# Patient Record
Sex: Female | Born: 1985
Health system: Southern US, Community
[De-identification: ages and names within clinical notes are randomized; demographics above are authoritative.]

## PROBLEM LIST (undated history)

## (undated) DIAGNOSIS — M545 Low back pain, unspecified: Secondary | ICD-10-CM

## (undated) DIAGNOSIS — G8929 Other chronic pain: Secondary | ICD-10-CM

## (undated) DIAGNOSIS — R7303 Prediabetes: Secondary | ICD-10-CM

## (undated) DIAGNOSIS — J3489 Other specified disorders of nose and nasal sinuses: Secondary | ICD-10-CM

## (undated) DIAGNOSIS — F419 Anxiety disorder, unspecified: Secondary | ICD-10-CM

## (undated) DIAGNOSIS — Q796 Ehlers-Danlos syndrome, unspecified: Secondary | ICD-10-CM

## (undated) DIAGNOSIS — I10 Essential (primary) hypertension: Secondary | ICD-10-CM

## (undated) DIAGNOSIS — F329 Major depressive disorder, single episode, unspecified: Secondary | ICD-10-CM

## (undated) DIAGNOSIS — F32A Depression, unspecified: Secondary | ICD-10-CM

## (undated) HISTORY — DX: Ehlers-Danlos syndrome, unspecified: Q79.60

## (undated) HISTORY — DX: Depression, unspecified: F32.A

## (undated) HISTORY — PX: EAR CYST EXCISION: SHX22

## (undated) HISTORY — PX: CERVICAL BIOPSY: SHX590

## (undated) HISTORY — DX: Essential (primary) hypertension: I10

---
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
## 2007-08-26 IMAGING — US US OB COMP LESS 14 WK
1 series · 18 of 28 positions shown · non-contrast
Comparison: none

CLINICAL DATA: Uncertain LMP.  LMP 11/11/05.  Evaluate dating and viability.  
 OBSTETRICAL ULTRASOUND <14 WKS:
TECHNIQUE: Transabdominal ultrasound was performed for evaluation of the gestation as well as the maternal uterus and adnexal regions.

[Series 1: us ob comp less 14 wks · 18 of 31 slices shown]
[im 1/31]
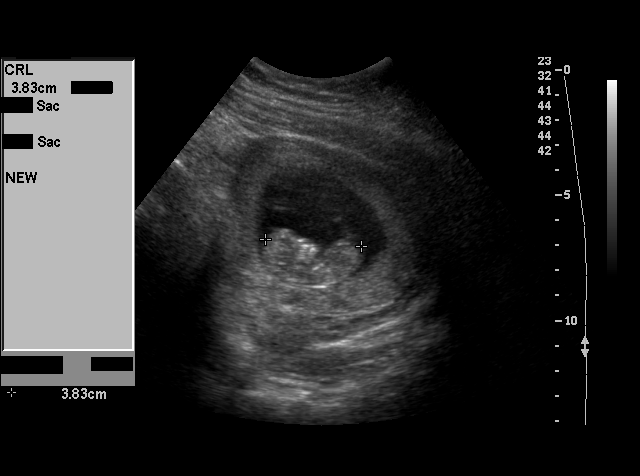
[im 3/31]
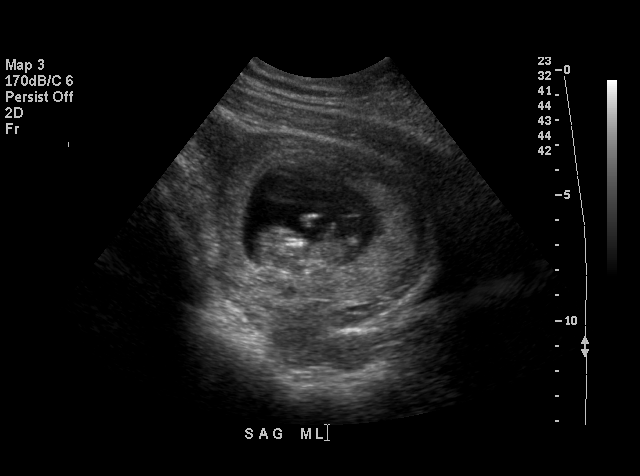
[im 4/31]
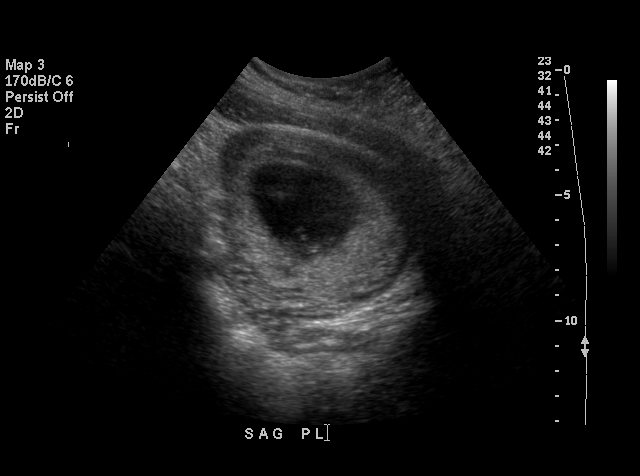
[im 6/31]
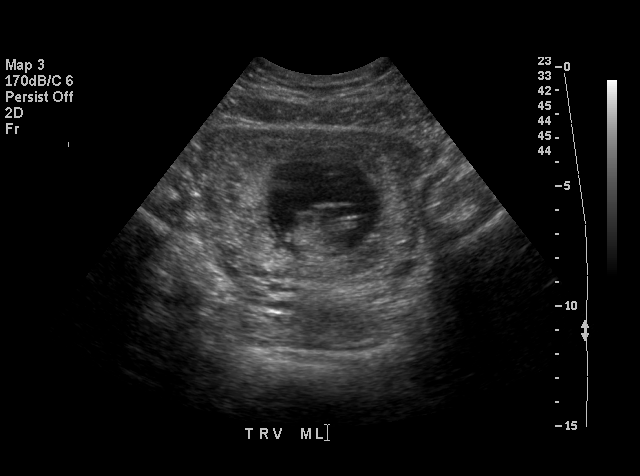
[im 8/31]
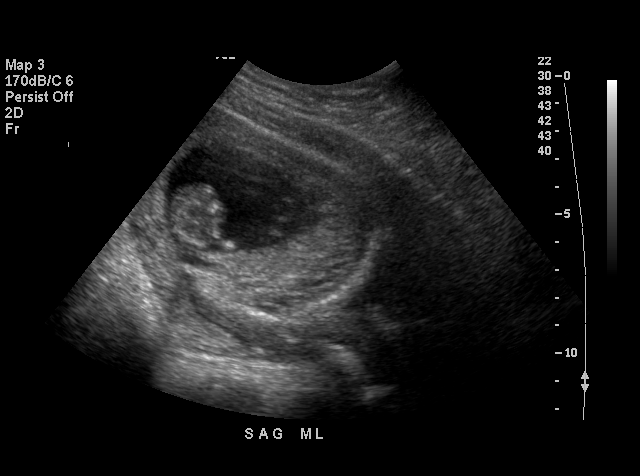
[im 9/31]
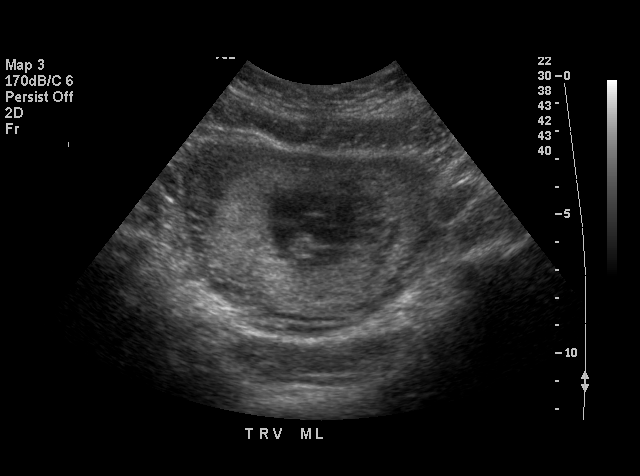
[im 12/31]
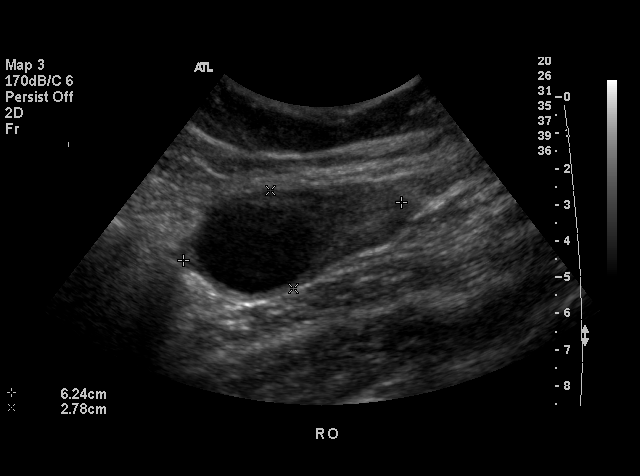
[im 13/31]
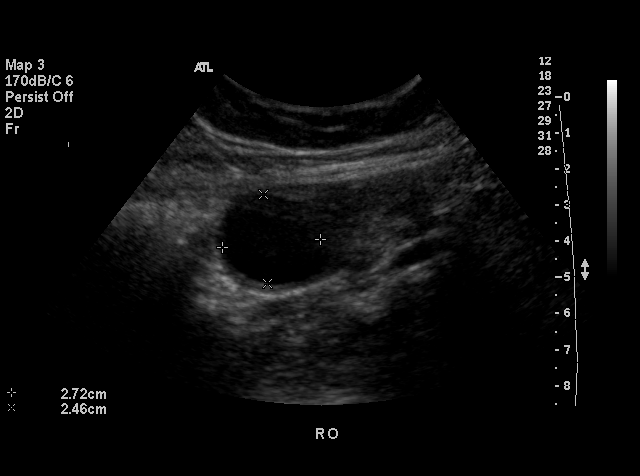
[im 15/31]
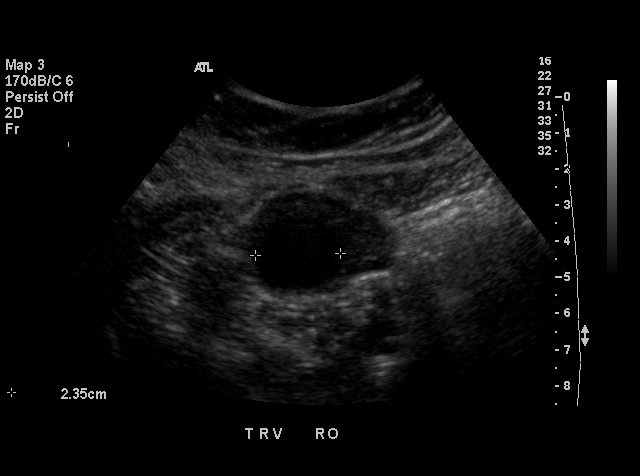
[im 16/31]
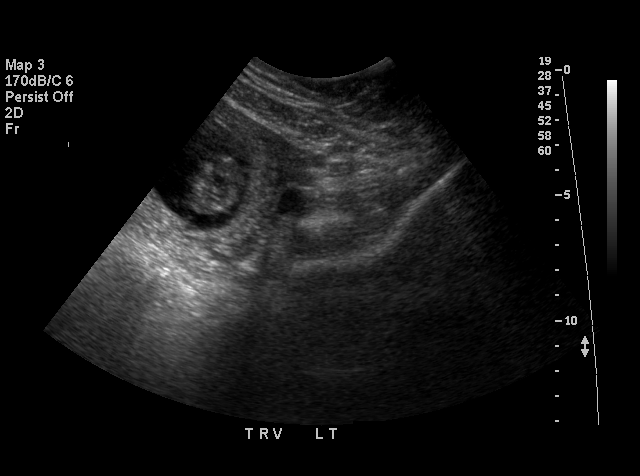
[im 18/31]
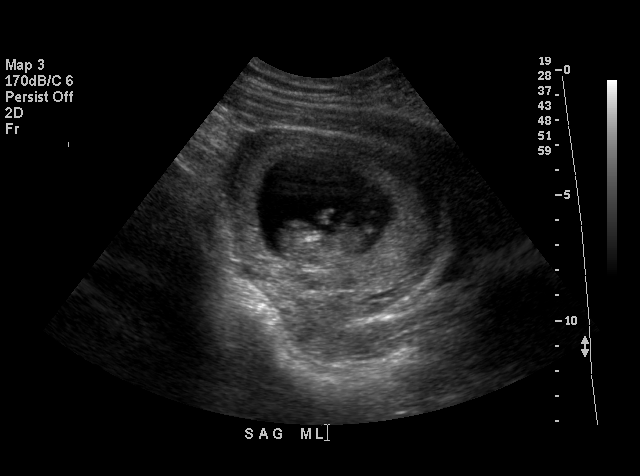
[im 19/31]
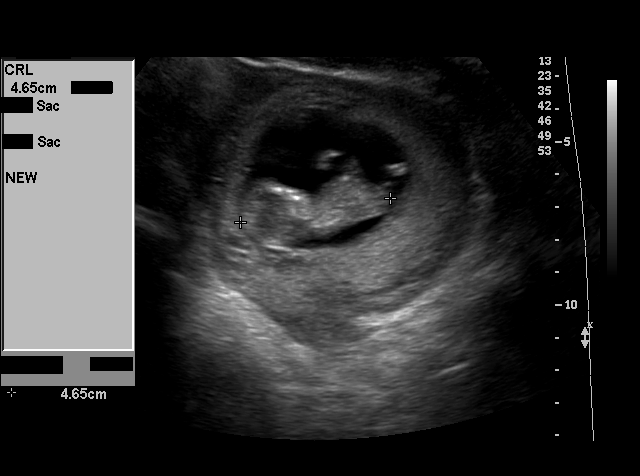
[im 22/31]
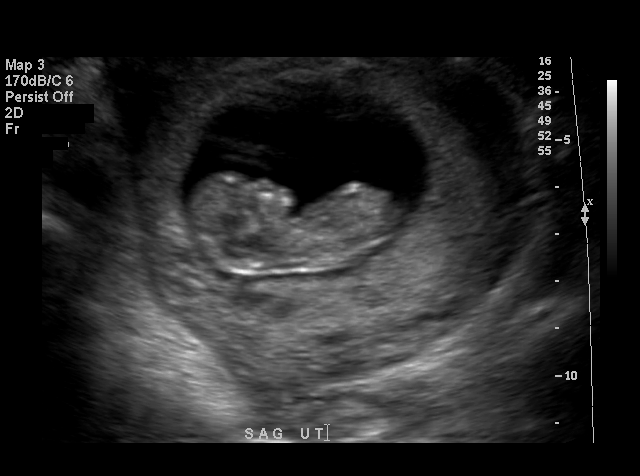
[im 24/31]
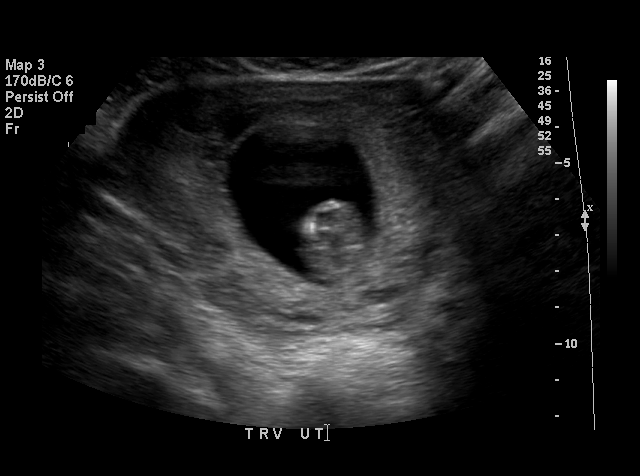
[im 25/31]
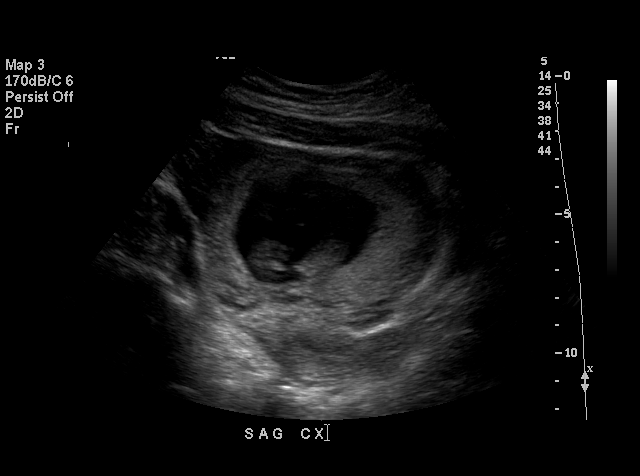
[im 27/31]
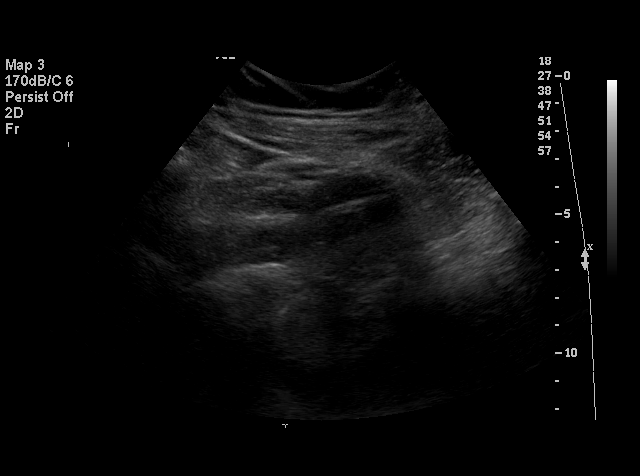
[im 28/31]
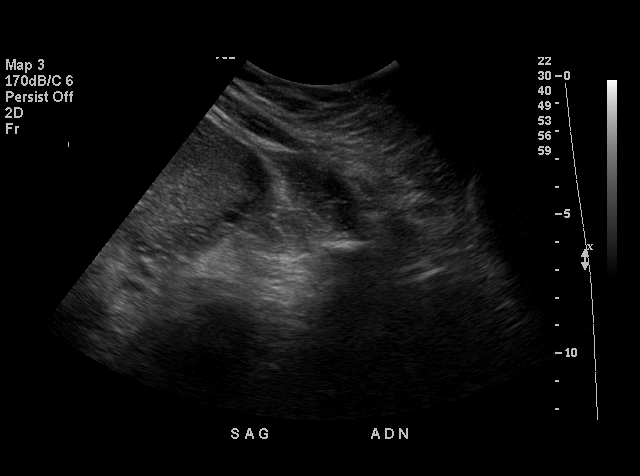
[im 31/31]
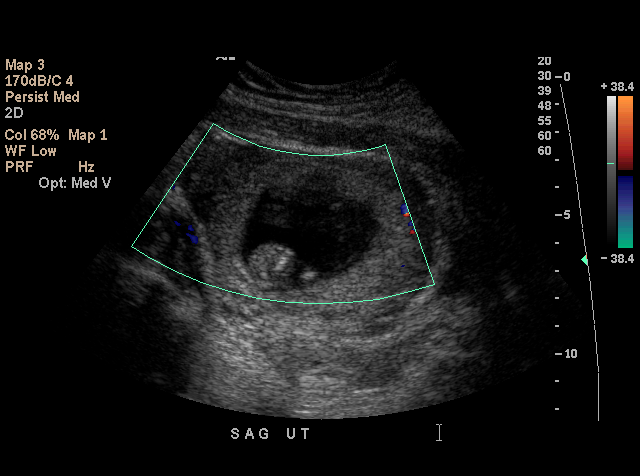

[18 of 28 positions shown; findings below may reference images not displayed]

FINDINGS: A single living intrauterine gestation is seen with measured heart rate of 162.  Fetal movement is noted.  Crown rump length measures 4.8 cm, corresponding with a gestational age of 11 weeks 4 days.  There is no evidence of subchorionic hemorrhage.  No fibroids or other uterine abnormalities are identified.  
 The right ovary contains a corpus luteum cyst measuring 2.5 cm.  The left ovary is not directly visualized, but no adnexal masses or free fluid are identified.
IMPRESSION: 1.  Single living intrauterine gestation with estimated gestational age of 11 weeks 4 days and sonographic EDC of 11/12/05.  This is concordant with LMP.
 2.  No maternal uterine or adnexal abnormality identified.

## 2008-02-22 IMAGING — US US OB FOLLOW-UP
1 series · 13 of 28 positions shown · non-contrast
Comparison: none

CLINICAL DATA: Assess growth and fetal well-being.  Pregnancy induced hypertension with size greater than dates.

[Series 1: us ob follow-up · 35 acquisitions, 13 frames shown]
[im 2/35]
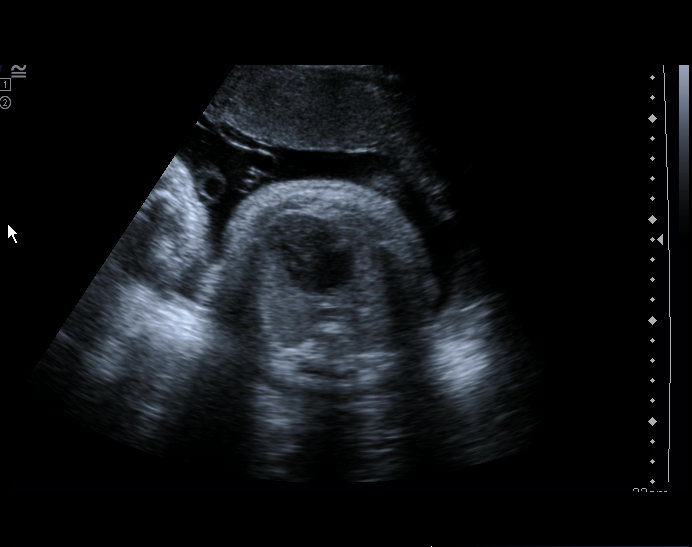
[im 4/35]
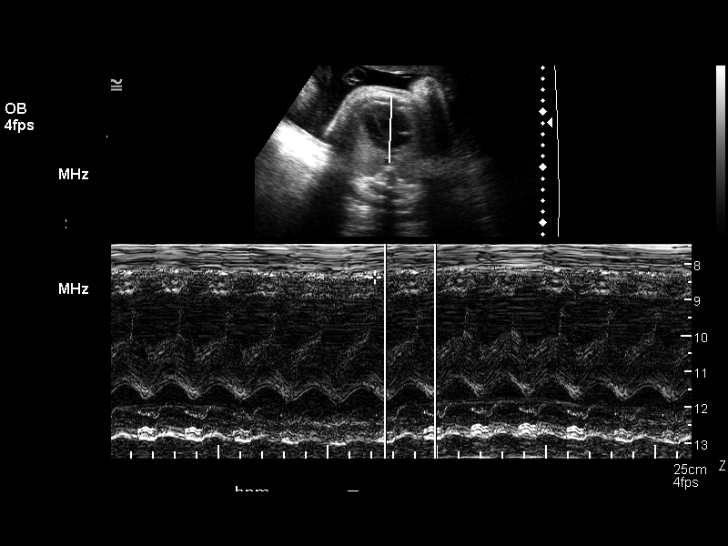
[im 7/35]
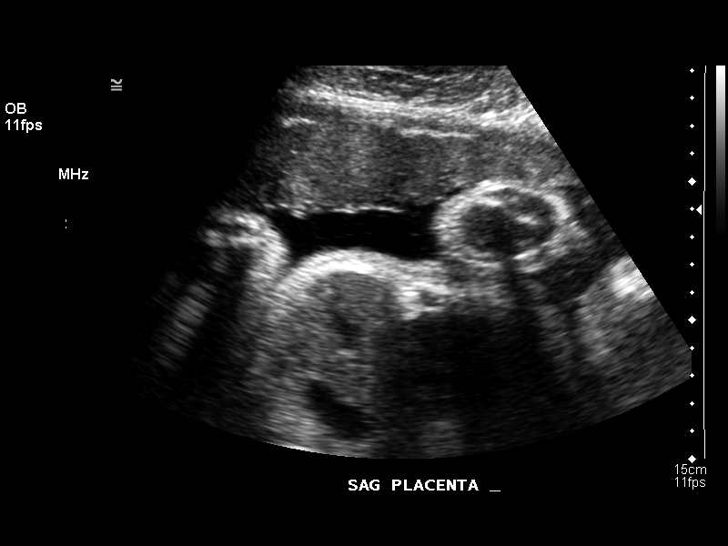
[im 9/35]
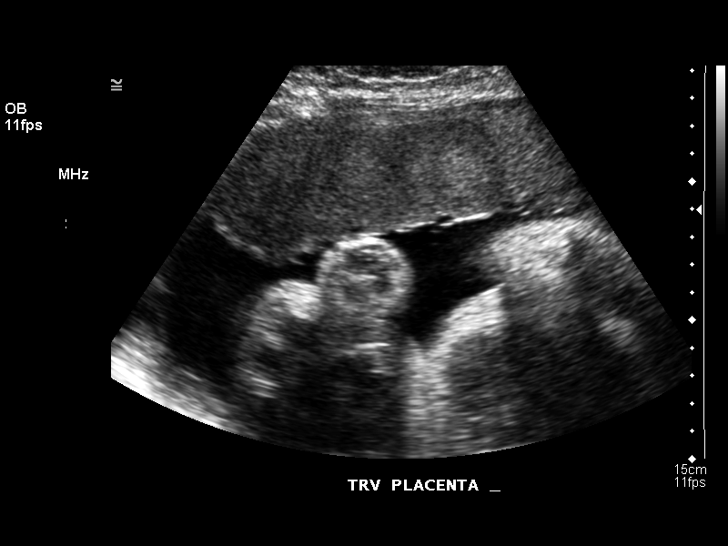
[im 12/35]
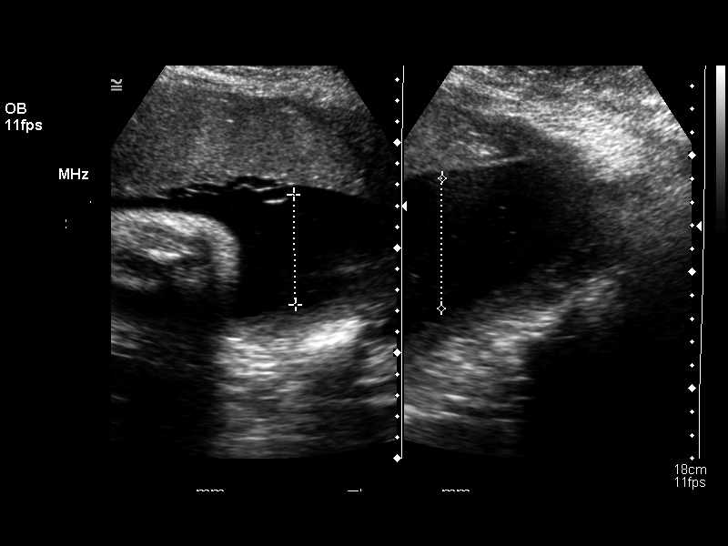
[im 14/35]
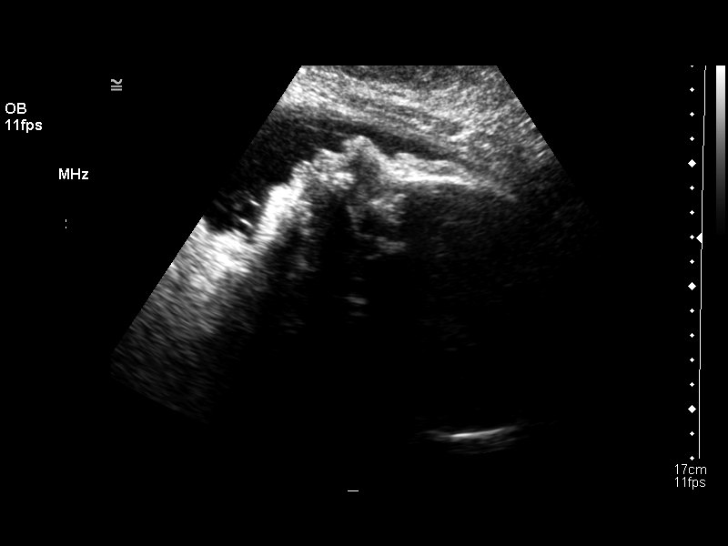
[im 18/35]
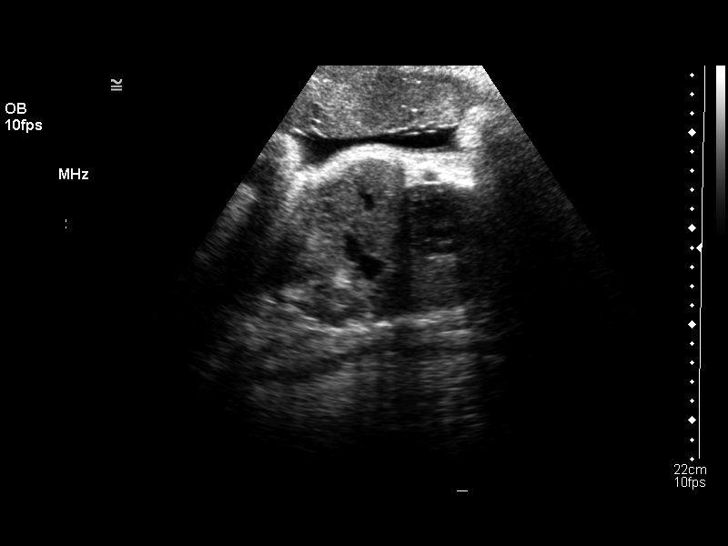
[im 21/35]
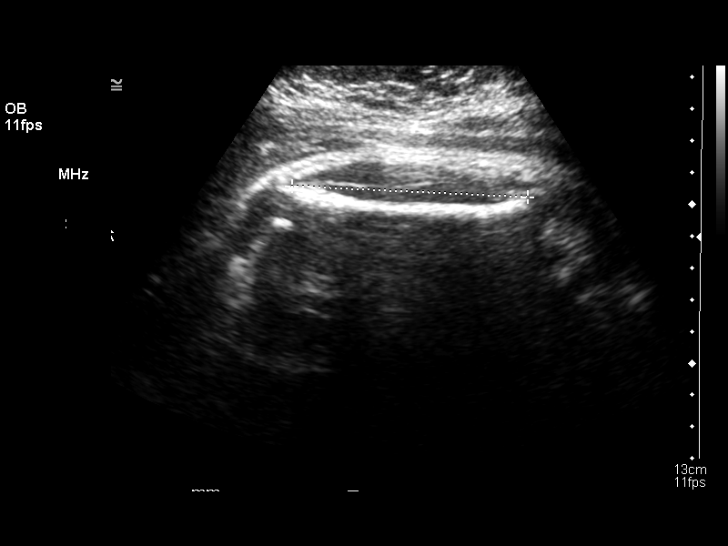
[im 23/35]
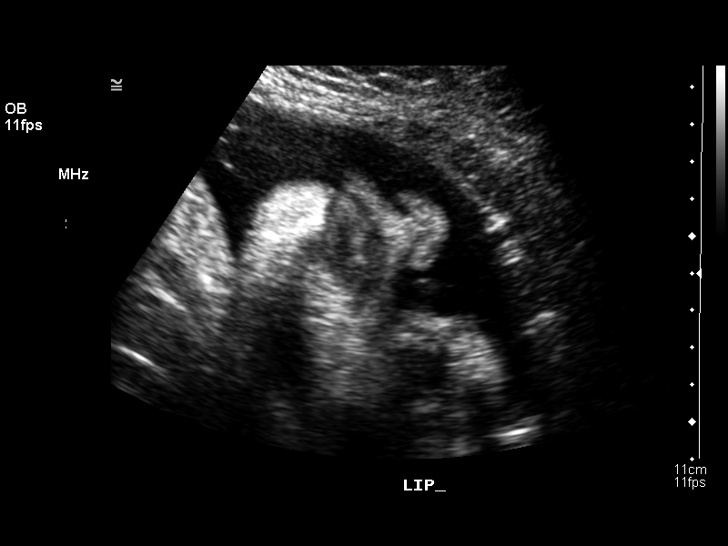
[im 26/35]
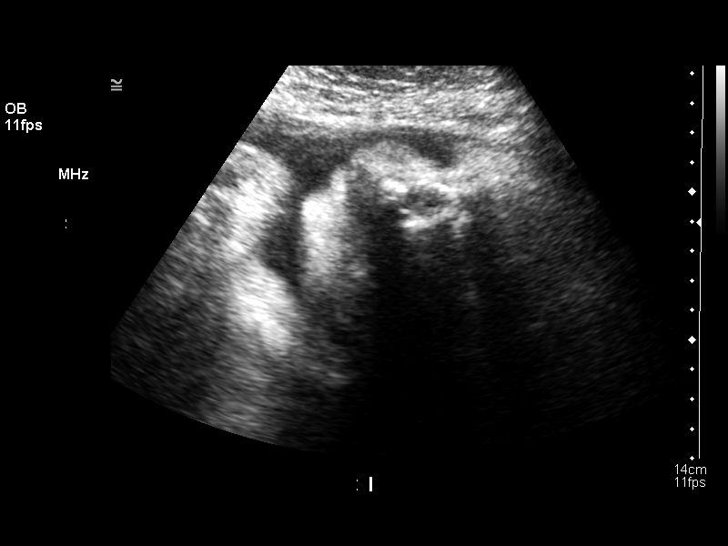
[im 28/35]
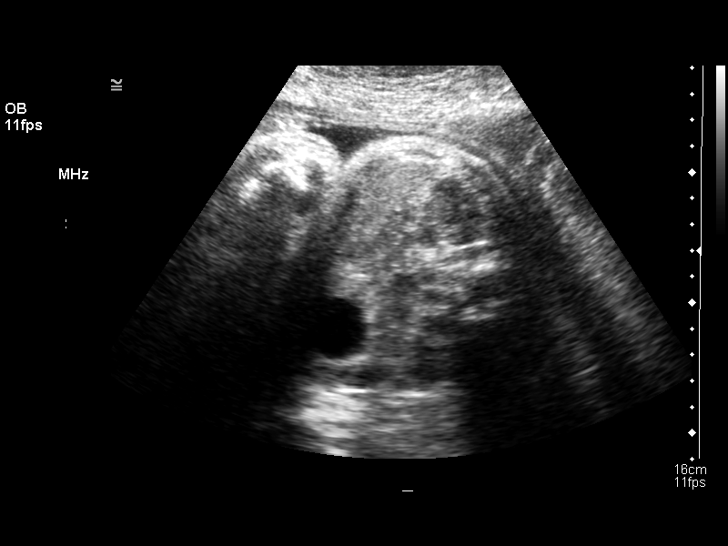
[im 31/35]
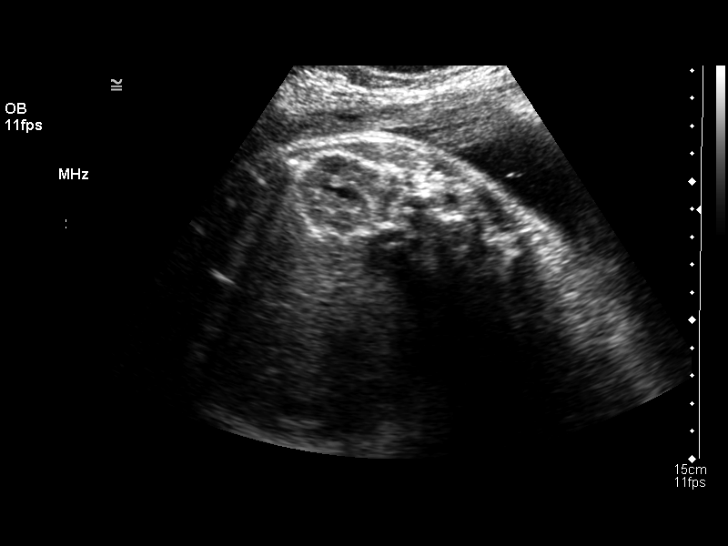
[im 33/35]
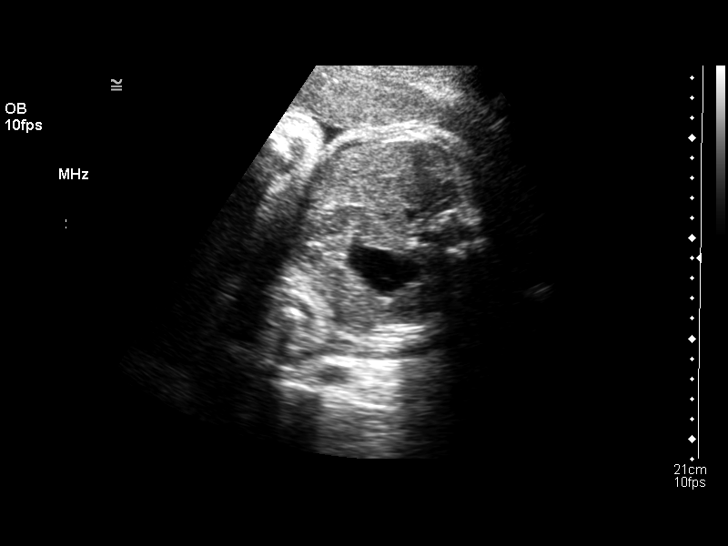

[13 of 28 positions shown; findings below may reference images not displayed]

OBSTETRICAL ULTRASOUND RE-EVALUATION:
 Number of Fetuses: 1
 Heart Rate:  131
 Movement:  Yes
 Breathing:  No
 Presentation:  Cephalic
 Placental Location:  Anterior
 Grade:  II
 Previa:  No
 Amniotic Fluid (subjective):  Normal
 Amniotic Fluid (objective):  21.6 cm AFI (5th -95th%ile = 7.3 – 24.4 cm for 37 wks)

 FETAL BIOMETRY
 BPD:  9.2 cm   37 w 2 d
 HC:  33.0 cm   37 w 2 d
 AC:   36.0 cm  39 w 4 d
 FL:   7.4 cm  37 w 6 d

 Mean GA:  38 w 0 d  US EDC:  11/07/05
 Assigned GA:  37 w 3 d  Assigned EDC:  11/11/05
 Fetal indices are within normal limits.
 EFW:  5363 + / - 532 g (H) 90th – 95th%ile (4084 – 5008 g) For 37 wks)

 FETAL ANATOMY
 Lateral Ventricles:  Visualized 
 Thalami/CSP:  Previously seen 
 Posterior Fossa:  Previously seen 
 Nuchal Region:  Previously seen 
 Spine:  Previously seen 
 4 Chamber Heart on Left:  Visualized 
 Stomach on Left:  Visualized 
 3 Vessel Cord:  Previously seen   
 Cord Insertion Site:  Previously seen 
 Kidneys:  Visualized 
 Bladder:  Visualized 
 Extremities:  Previously seen 

 ADDITIONAL ANATOMY VISUALIZED:  Diaphragm and male genitalia.

 MATERNAL UTERINE AND ADNEXAL FINDINGS
 Cervix: Not evaluated > 34 wks
 BIOPHYSICAL PROFILE

 Movement:  2    Time:  30 minutes
 Breathing:  0
 Tone: 2
 Amniotic Fluid: 2

 Total Score: 6
IMPRESSION: 1.  Single intrauterine pregnancy demonstrating an estimated gestational age by ultrasound of 38 weeks and 0 days.  Currently the abdominal circumference is larger than the remaining gestational indicators with an absolute measurement of 36.0 cm (39 weeks and 4 days), and this is 2 weeks and 1 day ahead of assigned gestational age by LMP of 37 weeks and 3 days.  Currently the estimated fetal weight is 5363 + / - 532 grams and this is between the 90th and 95th percentile for a 37 week gestation.  Findings are compatible with a large for gestational age fetus and continued close follow-up for growth is recommended to exclude the possibility of a developing macrosomic fetus.
 2.  Subjectively and quantitatively normal amniotic fluid volume.
 3.  Biophysical profile score [DATE].  The fetus score zero for lack of fetal breathing.  
 4.  No late developing fetal anatomic abnormalities are identified associated with the lateral ventricles, four chamber heart, stomach, kidneys, or bladder.

## 2010-04-23 ENCOUNTER — Encounter: Payer: Self-pay | Admitting: Obstetrics & Gynecology

## 2018-06-23 ENCOUNTER — Other Ambulatory Visit: Payer: Self-pay

## 2018-06-23 DIAGNOSIS — Z1329 Encounter for screening for other suspected endocrine disorder: Secondary | ICD-10-CM

## 2018-06-23 DIAGNOSIS — Z1389 Encounter for screening for other disorder: Secondary | ICD-10-CM

## 2018-06-23 DIAGNOSIS — Z13220 Encounter for screening for lipoid disorders: Secondary | ICD-10-CM

## 2018-06-23 DIAGNOSIS — Z13228 Encounter for screening for other metabolic disorders: Secondary | ICD-10-CM

## 2018-06-25 ENCOUNTER — Other Ambulatory Visit: Payer: Self-pay

## 2018-06-25 ENCOUNTER — Ambulatory Visit (INDEPENDENT_AMBULATORY_CARE_PROVIDER_SITE_OTHER): Payer: BLUE CROSS/BLUE SHIELD | Admitting: Family Medicine

## 2018-06-25 ENCOUNTER — Ambulatory Visit: Payer: Self-pay | Admitting: Family Medicine

## 2018-06-25 DIAGNOSIS — Z1389 Encounter for screening for other disorder: Secondary | ICD-10-CM

## 2018-06-25 DIAGNOSIS — Z1329 Encounter for screening for other suspected endocrine disorder: Secondary | ICD-10-CM

## 2018-06-25 DIAGNOSIS — Z13228 Encounter for screening for other metabolic disorders: Secondary | ICD-10-CM

## 2018-06-25 DIAGNOSIS — Z13220 Encounter for screening for lipoid disorders: Secondary | ICD-10-CM

## 2018-06-25 LAB — POCT URINALYSIS DIP (MANUAL ENTRY)
BILIRUBIN UA: NEGATIVE
BILIRUBIN UA: NEGATIVE mg/dL
GLUCOSE UA: NEGATIVE mg/dL
Leukocytes, UA: NEGATIVE
Nitrite, UA: NEGATIVE
Protein Ur, POC: 30 mg/dL — AB
RBC UA: NEGATIVE
SPEC GRAV UA: 1.025 (ref 1.010–1.025)
Urobilinogen, UA: 0.2 E.U./dL
pH, UA: 5.5 (ref 5.0–8.0)

## 2018-06-26 LAB — CMP14+EGFR
A/G RATIO: 2 (ref 1.2–2.2)
ALT: 37 IU/L — ABNORMAL HIGH (ref 0–32)
AST: 21 IU/L (ref 0–40)
Albumin: 4.5 g/dL (ref 3.8–4.8)
Alkaline Phosphatase: 54 IU/L (ref 39–117)
BUN/Creatinine Ratio: 19 (ref 9–23)
BUN: 16 mg/dL (ref 6–20)
Bilirubin Total: 0.3 mg/dL (ref 0.0–1.2)
CALCIUM: 10.4 mg/dL — AB (ref 8.7–10.2)
CO2: 24 mmol/L (ref 20–29)
CREATININE: 0.86 mg/dL (ref 0.57–1.00)
Chloride: 102 mmol/L (ref 96–106)
GFR, EST AFRICAN AMERICAN: 103 mL/min/{1.73_m2} (ref >59)
GFR, EST NON AFRICAN AMERICAN: 90 mL/min/{1.73_m2} (ref >59)
Globulin, Total: 2.3 g/dL (ref 1.5–4.5)
Glucose: 110 mg/dL — ABNORMAL HIGH (ref 65–99)
Potassium: 4.4 mmol/L (ref 3.5–5.2)
Sodium: 140 mmol/L (ref 134–144)
TOTAL PROTEIN: 6.8 g/dL (ref 6.0–8.5)

## 2018-06-26 LAB — LIPID PANEL
CHOL/HDL RATIO: 3.7 ratio (ref 0.0–4.4)
CHOLESTEROL TOTAL: 191 mg/dL (ref 100–199)
HDL: 52 mg/dL (ref >39)
LDL CALC: 86 mg/dL (ref 0–99)
TRIGLYCERIDES: 263 mg/dL — AB (ref 0–149)
VLDL CHOLESTEROL CAL: 53 mg/dL — AB (ref 5–40)

## 2018-06-26 LAB — TSH: TSH: 0.946 u[IU]/mL (ref 0.450–4.500)

## 2018-06-27 ENCOUNTER — Telehealth (INDEPENDENT_AMBULATORY_CARE_PROVIDER_SITE_OTHER): Payer: BLUE CROSS/BLUE SHIELD | Admitting: Family Medicine

## 2018-06-27 ENCOUNTER — Encounter: Payer: Self-pay | Admitting: Family Medicine

## 2018-06-27 ENCOUNTER — Other Ambulatory Visit: Payer: Self-pay

## 2018-06-27 ENCOUNTER — Ambulatory Visit: Payer: Self-pay | Admitting: Family Medicine

## 2018-06-27 DIAGNOSIS — Z636 Dependent relative needing care at home: Secondary | ICD-10-CM

## 2018-06-27 DIAGNOSIS — I10 Essential (primary) hypertension: Secondary | ICD-10-CM

## 2018-06-27 DIAGNOSIS — Z975 Presence of (intrauterine) contraceptive device: Secondary | ICD-10-CM

## 2018-06-27 DIAGNOSIS — F321 Major depressive disorder, single episode, moderate: Secondary | ICD-10-CM | POA: Diagnosis not present

## 2018-06-27 DIAGNOSIS — R002 Palpitations: Secondary | ICD-10-CM

## 2018-06-27 DIAGNOSIS — Q796 Ehlers-Danlos syndrome, unspecified: Secondary | ICD-10-CM

## 2018-06-27 MED ORDER — METOPROLOL-HYDROCHLOROTHIAZIDE 50-25 MG PO TABS: 1.0000 | ORAL_TABLET | Freq: Every day | ORAL | 1 refills | Status: DC

## 2018-06-27 MED ORDER — DULOXETINE HCL 40 MG PO CPEP: 40.0000 mg | ORAL_CAPSULE | Freq: Every day | ORAL | 1 refills | Status: DC

## 2018-06-27 NOTE — Patient Instructions (Addendum)
   If you have lab work done today you will be contacted with your lab results within the next 2 weeks.  If you have not heard from us then please contact us. The fastest way to get your results is to register for My Chart.   IF you received an x-ray today, you will receive an invoice from [facility]. Please contact [facility] at [phone] with questions or concerns regarding your invoice.   IF you received labwork today, you will receive an invoice from [facility]. Please contact [facility] at [phone] with questions or concerns regarding your invoice.   Our billing staff will not be able to assist you with questions regarding bills from these companies.  You will be contacted with the lab results as soon as they are available. The fastest way to get your results is to activate your My Chart account. Instructions are located on the last page of this paperwork. If you have not heard from us regarding the results in 2 weeks, please contact this office.     Palpitations Palpitations are feelings that your heartbeat is irregular or is faster than normal. It may feel like your heart is fluttering or skipping a beat. Palpitations are usually not a serious problem. They may be caused by many things, including smoking, caffeine, alcohol, stress, and certain medicines or drugs. Most causes of palpitations are not serious. However, some palpitations can be a sign of a serious problem. You may need further tests to rule out serious medical problems. Follow these instructions at home:     Pay attention to any changes in your condition. Take these actions to help manage your symptoms: Eating and drinking  Avoid foods and drinks that may cause palpitations. These may include: ? Caffeinated coffee, tea, soft drinks, diet pills, and energy drinks. ? Chocolate. ? Alcohol. Lifestyle  Take steps to reduce your stress and anxiety. Things that can help you relax  include: ? Yoga. ? Mind-body activities, such as deep breathing, meditation, or using words and images to create positive thoughts (guided imagery). ? Physical activity, such as swimming, jogging, or walking. Tell your health care provider if your palpitations increase with activity. If you have chest pain or shortness of breath with activity, do not continue the activity until you are seen by your health care provider. ? Biofeedback. This is a method that helps you learn to use your mind to control things in your body, such as your heartbeat.  Do not use drugs, including cocaine or ecstasy. Do not use marijuana.  Get plenty of rest and sleep. Keep a regular bed time. General instructions  Take over-the-counter and prescription medicines only as told by your health care provider.  Do not use any products that contain nicotine or tobacco, such as cigarettes and e-cigarettes. If you need help quitting, ask your health care provider.  Keep all follow-up visits as told by your health care provider. This is important. These may include visits for further testing if palpitations do not go away or get worse. Contact a health care provider if you:  Continue to have a fast or irregular heartbeat after 24 hours.  Notice that your palpitations occur more often. Get help right away if you:  Have chest pain or shortness of breath.  Have a severe headache.  Feel dizzy or you faint. Summary  Palpitations are feelings that your heartbeat is irregular or is faster than normal. It may feel like your heart is fluttering or skipping a   beat.  Palpitations may be caused by many things, including smoking, caffeine, alcohol, stress, certain medicines, and drugs.  Although most causes of palpitations are not serious, some causes can be a sign of a serious medical problem.  Get help right away if you faint or have chest pain, shortness of breath, a severe headache, or dizziness. This information is not  intended to replace advice given to you by your health care provider. Make sure you discuss any questions you have with your health care provider. Document Released: 03/16/2000 Document Revised: 05/01/2017 Document Reviewed: 05/01/2017 Elsevier Interactive Patient Education  2019 [employer]  

## 2018-06-27 NOTE — Progress Notes (Signed)
Diagnoses and all orders for this visit:  Current moderate episode of major depressive disorder without prior episode (HCC)- increase cymbalta to 40mg   No SI  Caregiver stress- discussed resources to help such as home health for her grandfather  Essential hypertension- uncontrolled Will change to metoprolol-hctz  IUD (intrauterine device) in place- no estrogen so less likely to raise bp  Ehlers-Danlos syndrome- cymabalta for pain  Palpitations-  Labs already done and there are no deficiencies Discontinue nifedipine Will do metoprolol-hctz Pt is on contraception with an IUD in place  Other orders -     metoprolol-hydrochlorothiazide (LOPRESSOR HCT) 50-25 MG tablet; Take 1 tablet by mouth daily. -     DULoxetine 40 MG CPEP; Take 40 mg by mouth daily.

## 2018-06-27 NOTE — Addendum Note (Signed)
Addended by: Collie Siad A on: 06/27/2018 10:20 AM   Modules accepted: Orders

## 2018-06-27 NOTE — Progress Notes (Signed)
Telemedicine Encounter- SOAP NOTE Established Patient  This telephone encounter was conducted with the patient's (or proxy's) verbal consent via audio telecommunications: yes/no: Yes Patient was instructed to have this encounter in a suitably private space; and to only have persons present to whom they give permission to participate. In addition, patient identity was confirmed by use of name plus two identifiers (DOB and address).  I discussed the limitations, risks, security and privacy concerns of performing an evaluation and management service by telephone and the availability of in person appointments. I also discussed with the patient that there may be a patient responsible charge related to this service. The patient expressed understanding and agreed to proceed.  I spent a total of TIME; 0 MIN TO 60 MIN: 20 minutes talking with the patient or their proxy.  CC: palpitations  Subjective   Tiffany Molina is a 33 y.o. established patient. Telephone visit today for  HPI Hypertension and Palpitations Patient reports that she has been taking nifedipine for a few months by her Gynecologist She states that she has been taking nifedipine 109m at night On the nights that she would miss her medication she would notice that she did not have the heart palpitations  She states that she states that they usually last all night and wears off 1-2 hours after she wakes up She gets flushing of her face She was diagnosed with Erlos Danlos syndrome diagnosed by a Genetist in 2019  She was given metoprolol in the past   She has 3 children and does not plan to get pregnant and is on getting pregnant  She is on the Mirena which was placed by Gyne in 2017  She gets some issues with constipation and she usually drinks water which corrects it   She would like an increase of her Cymbalta She states that she is stressed as she is taking care of 3 children and her grandfather as well as the coronavirus  She also takes the cymbalta for her aches and pains from E-Danlos Syndrome She denies postpartum depression   There are no active problems to display for this patient.   No past medical history on file.  Current Outpatient Medications  Medication Sig Dispense Refill  . DULoxetine 40 MG CPEP Take 40 mg by mouth daily. 90 capsule 1  . NIFEdipine (ADALAT CC) 60 MG 24 hr tablet Take 60 mg by mouth daily.    . metoprolol-hydrochlorothiazide (LOPRESSOR HCT) 50-25 MG tablet Take 1 tablet by mouth daily. 90 tablet 1   No current facility-administered medications for this visit.     Allergies  Allergen Reactions  . Sulfur Rash    Social History   Socioeconomic History  . Marital status: Single    Spouse name: Not on file  . Number of children: Not on file  . Years of education: Not on file  . Highest education level: Not on file  Occupational History  . Not on file  Social Needs  . Financial resource strain: Not on file  . Food insecurity:    Worry: Not on file    Inability: Not on file  . Transportation needs:    Medical: Not on file    Non-medical: Not on file  Tobacco Use  . Smoking status: Not on file  Substance and Sexual Activity  . Alcohol use: Not on file  . Drug use: Not on file  . Sexual activity: Not on file  Lifestyle  . Physical activity:  Days per week: Not on file    Minutes per session: Not on file  . Stress: Not on file  Relationships  . Social connections:    Talks on phone: Not on file    Gets together: Not on file    Attends religious service: Not on file    Active member of club or organization: Not on file    Attends meetings of clubs or organizations: Not on file    Relationship status: Not on file  . Intimate partner violence:    Fear of current or ex partner: Not on file    Emotionally abused: Not on file    Physically abused: Not on file    Forced sexual activity: Not on file  Other Topics Concern  . Not on file  Social History  Narrative  . Not on file    ROS  Objective   Vitals as reported by the patient: There were no vitals filed for this visit. 148/90   Diagnoses and all orders for this visit:  Current moderate episode of major depressive disorder without prior episode ([place])  Caregiver stress  Essential hypertension -     Hemoglobin A1c; Future -     Lipid panel; Future -     CMP14+EGFR; Future  IUD (intrauterine device) in place  Ehlers-Danlos syndrome  Palpitations -     CBC; Future -     Hemoglobin A1c; Future -     TSH; Future -     Lipid panel; Future -     CMP14+EGFR; Future  Other orders -     metoprolol-hydrochlorothiazide (LOPRESSOR HCT) 50-25 MG tablet; Take 1 tablet by mouth daily. -     DULoxetine 40 MG CPEP; Take 40 mg by mouth daily.     I discussed the assessment and treatment plan with the patient. The patient was provided an opportunity to ask questions and all were answered. The patient agreed with the plan and demonstrated an understanding of the instructions.   The patient was advised to call back or seek an in-person evaluation if the symptoms worsen or if the condition fails to improve as anticipated.  I provided 20 minutes of non-face-to-face time during this encounter.  Forrest Moron, MD  [facility]

## 2018-06-27 NOTE — Progress Notes (Signed)
CC: ? Increasing cymbalta dosage and heart palpitations from bp med nifedipine 60 mg, bp's have increased since taking and bp's are as high as they were before starting medication.  Is requesting another bp med without the side effects of palpitations.   Depression screen: 59

## 2018-08-28 ENCOUNTER — Telehealth: Payer: Self-pay | Admitting: Family Medicine

## 2018-08-28 NOTE — Telephone Encounter (Signed)
Called pt today to schedule a 2 month follow up due now . per Dr.Stallings note in CRM from last visit

## 2018-08-28 NOTE — Progress Notes (Signed)
Called pt today to schedule a 2 month due now from visit LVM  For pt to call back and FR

## 2019-01-03 ENCOUNTER — Other Ambulatory Visit: Payer: Self-pay | Admitting: Family Medicine

## 2019-01-03 NOTE — Telephone Encounter (Signed)
Forwarding medication refill requests to the clinical pool for review. 

## 2019-01-05 ENCOUNTER — Other Ambulatory Visit: Payer: Self-pay

## 2019-01-05 ENCOUNTER — Ambulatory Visit: Payer: BC Managed Care – PPO | Admitting: Family Medicine

## 2019-01-05 ENCOUNTER — Encounter: Payer: Self-pay | Admitting: Family Medicine

## 2019-01-05 VITALS — BP 148/80 | HR 79 | Temp 98.5°F | Resp 17 | Ht 62.0 in | Wt 194.6 lb

## 2019-01-05 DIAGNOSIS — R739 Hyperglycemia, unspecified: Secondary | ICD-10-CM

## 2019-01-05 DIAGNOSIS — Z833 Family history of diabetes mellitus: Secondary | ICD-10-CM

## 2019-01-05 DIAGNOSIS — R7303 Prediabetes: Secondary | ICD-10-CM

## 2019-01-05 DIAGNOSIS — Q796 Ehlers-Danlos syndrome, unspecified: Secondary | ICD-10-CM | POA: Diagnosis not present

## 2019-01-05 DIAGNOSIS — Z8632 Personal history of gestational diabetes: Secondary | ICD-10-CM

## 2019-01-05 LAB — POCT GLYCOSYLATED HEMOGLOBIN (HGB A1C): Hemoglobin A1C: 5.8 % — AB (ref 4.0–5.6)

## 2019-01-05 NOTE — Patient Instructions (Signed)
° ° ° °  If you have lab work done today you will be contacted with your lab results within the next 2 weeks.  If you have not heard from us then please contact us. The fastest way to get your results is to register for My Chart. ° ° °IF you received an x-ray today, you will receive an invoice from [facility]. Please contact [facility] at [phone] with questions or concerns regarding your invoice.  ° °IF you received labwork today, you will receive an invoice from [facility]. Please contact [facility] at [phone] with questions or concerns regarding your invoice.  ° °Our billing staff will not be able to assist you with questions regarding bills from these companies. ° °You will be contacted with the lab results as soon as they are available. The fastest way to get your results is to activate your My Chart account. Instructions are located on the last page of this paperwork. If you have not heard from us regarding the results in 2 weeks, please contact this office. °  ° ° ° °

## 2019-01-05 NOTE — Progress Notes (Signed)
Established Patient Office Visit  Subjective:  Patient ID: Tiffany Molina, female    DOB: November 23, 1985  Age: 33 y.o. MRN: [id number]  CC:  Chief Complaint  Patient presents with  . A1C testing, blood pressure, fatigue and more  pain w/EDS  . Medication Refill    duloxentine, and metoprolol-hctz    HPI Tiffany ChainVictoriana Drone presents for   Hypertension: Patient here for follow-up of elevated blood pressure. She is exercising and is adherent to low salt diet.  Blood pressure is well controlled at home. Cardiac symptoms none. Patient denies chest pain, exertional chest pressure/discomfort, irregular heart beat and orthopnea.  Cardiovascular risk factors: obesity (BMI >= 30 kg/m2). Use of agents associated with hypertension: none. History of target organ damage: hypertension  She reports that her blood pressure is 140s/80s at home She states that she does exercise at house work   EDS and Fatigue She has joint pain and back pain She gets shoulder pain and joint pain constantly She reports that she increased the Cymbalta but the relief is not long lasting She states that cymbalta helps her On a daily bases her pain is 4-5 out of 10 and can be as intense as a 7/10  History reviewed. No pertinent past medical history.  History reviewed. No pertinent surgical history.  History reviewed. No pertinent family history.  Social History   Socioeconomic History  . Marital status: Married    Spouse name: Not on file  . Number of children: Not on file  . Years of education: Not on file  . Highest education level: Not on file  Occupational History  . Not on file  Social Needs  . Financial resource strain: Not on file  . Food insecurity    Worry: Not on file    Inability: Not on file  . Transportation needs    Medical: Not on file    Non-medical: Not on file  Tobacco Use  . Smoking status: Never Smoker  . Smokeless tobacco: Never Used  Substance and Sexual Activity  . Alcohol use: Not  Currently  . Drug use: Never  . Sexual activity: Yes    Partners: Male    Birth control/protection: I.U.D.  Lifestyle  . Physical activity    Days per week: Not on file    Minutes per session: Not on file  . Stress: Not on file  Relationships  . Social Musicianconnections    Talks on phone: Not on file    Gets together: Not on file    Attends religious service: Not on file    Active member of club or organization: Not on file    Attends meetings of clubs or organizations: Not on file    Relationship status: Not on file  . Intimate partner violence    Fear of current or ex partner: Not on file    Emotionally abused: Not on file    Physically abused: Not on file    Forced sexual activity: Not on file  Other Topics Concern  . Not on file  Social History Narrative  . Not on file    Outpatient Medications Prior to Visit  Medication Sig Dispense Refill  . DULoxetine 40 MG CPEP Take 40 mg by mouth daily. 90 capsule 1  . metoprolol-hydrochlorothiazide (LOPRESSOR HCT) 50-25 MG tablet Take 1 tablet by mouth daily. 90 tablet 1  . NIFEdipine (ADALAT CC) 60 MG 24 hr tablet Take 60 mg by mouth daily.     No facility-administered medications prior  to visit.     Allergies  Allergen Reactions  . Sulfur Rash    ROS Review of Systems Review of Systems  Constitutional: Negative for activity change, appetite change, chills and fever.  HENT: Negative for congestion, nosebleeds, trouble swallowing and voice change.   Respiratory: Negative for cough, shortness of breath and wheezing.   Gastrointestinal: Negative for diarrhea, nausea and vomiting.  Genitourinary: Negative for difficulty urinating, dysuria, flank pain and hematuria.  Musculoskeletal: JOINT PAIN ALL OVER Neurological: Negative for dizziness, speech difficulty, light-headedness and numbness.  See HPI. All other review of systems negative.     Objective:    Physical Exam  BP (!) 148/80 (BP Location: Right Arm, Patient  Position: Sitting, Cuff Size: Large)   Pulse 79   Temp 98.5 F (36.9 C) (Oral)   Resp 17   Ht 5\' 2"  (1.575 m)   Wt 194 lb 9.6 oz (88.3 kg)   SpO2 98%   BMI 35.59 kg/m  Wt Readings from Last 3 Encounters:  01/05/19 194 lb 9.6 oz (88.3 kg)   Physical Exam  Constitutional: Oriented to person, place, and time. Appears well-developed and well-nourished.  HENT:  Head: Normocephalic and atraumatic.  Eyes: Conjunctivae and EOM are normal.  Cardiovascular: Normal rate, regular rhythm, normal heart sounds and intact distal pulses.  No murmur heard. Pulmonary/Chest: Effort normal and breath sounds normal. No stridor. No respiratory distress. Has no wheezes.  Neurological: Is alert and oriented to person, place, and time.  Skin: Skin is warm. Capillary refill takes less than 2 seconds.  Psychiatric: Has a normal mood and affect. Behavior is normal. Judgment and thought content normal.    Health Maintenance Due  Topic Date Due  . HIV Screening  05/29/2001  . TETANUS/TDAP  05/29/2005  . PAP SMEAR-Modifier  05/30/2007  . INFLUENZA VACCINE  11/01/2018    There are no preventive care reminders to display for this patient.  Lab Results  Component Value Date   TSH 0.946 06/25/2018   No results found for: WBC, HGB, HCT, MCV, PLT Lab Results  Component Value Date   NA 140 06/25/2018   K 4.4 06/25/2018   CO2 24 06/25/2018   GLUCOSE 110 (H) 06/25/2018   BUN 16 06/25/2018   CREATININE 0.86 06/25/2018   BILITOT 0.3 06/25/2018   ALKPHOS 54 06/25/2018   AST 21 06/25/2018   ALT 37 (H) 06/25/2018   PROT 6.8 06/25/2018   ALBUMIN 4.5 06/25/2018   CALCIUM 10.4 (H) 06/25/2018   Lab Results  Component Value Date   CHOL 191 06/25/2018   Lab Results  Component Value Date   HDL 52 06/25/2018   Lab Results  Component Value Date   LDLCALC 86 06/25/2018   Lab Results  Component Value Date   TRIG 263 (H) 06/25/2018   Lab Results  Component Value Date   CHOLHDL 3.7 06/25/2018   Lab  Results  Component Value Date   HGBA1C 5.8 (A) 01/05/2019      Assessment & Plan:   Problem List Items Addressed This Visit    None    Visit Diagnoses    Hyperglycemia    -  Primary Impaired fasting glucose   Relevant Orders   POCT glycosylated hemoglobin (Hb A1C) (Completed)   Family history of diabetes mellitus       Relevant Orders   POCT glycosylated hemoglobin (Hb A1C) (Completed)   History of gestational diabetes       Relevant Orders   POCT glycosylated hemoglobin (  Hb A1C) (Completed)   Ehlers-Danlos syndrome    -  Discussed referrals to Rheumatology and Cardiology   Relevant Orders   Ambulatory referral to Rheumatology   Ambulatory referral to Cardiology   Prediabetes    -  Discussed diabetes prevention       No orders of the defined types were placed in this encounter.   Follow-up: No follow-ups on file.    Doristine Bosworth, MD

## 2019-01-12 ENCOUNTER — Encounter: Payer: Self-pay | Admitting: Cardiology

## 2019-01-12 ENCOUNTER — Ambulatory Visit (INDEPENDENT_AMBULATORY_CARE_PROVIDER_SITE_OTHER): Payer: BC Managed Care – PPO | Admitting: Cardiology

## 2019-01-12 ENCOUNTER — Other Ambulatory Visit: Payer: Self-pay

## 2019-01-12 VITALS — BP 155/114 | HR 74 | Temp 97.8°F | Ht 62.0 in | Wt 195.0 lb

## 2019-01-12 DIAGNOSIS — I10 Essential (primary) hypertension: Secondary | ICD-10-CM

## 2019-01-12 DIAGNOSIS — Q796 Ehlers-Danlos syndrome, unspecified: Secondary | ICD-10-CM

## 2019-01-12 MED ORDER — AMLODIPINE BESYLATE 5 MG PO TABS: 5.0000 mg | ORAL_TABLET | Freq: Every day | ORAL | 3 refills | Status: DC

## 2019-01-12 NOTE — Progress Notes (Signed)
Patient referred by Tiffany Molina, Tiffany A, MD [facility]  Subjective:   Tiffany Molina, female    DOB: 10/15/1985, 33 y.o.   MRN: [id number]   Chief Complaint  Patient presents with  . Ehlers Danlos Syndrome  . New Patient (Initial Visit)     HPI  33 year old Caucasian female with Ehlers-Danlos syndrome, prediabetes, history of gestational diabetes, referred to establish cardiac care.  Patient is originally from [place], lived in [place], [place] for [place] years, and return to [place] in [place].  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  She was diagnosed with Ehlers-Danlos syndrome in 2019 while in [place].  Her presenting complaint at that time was unusual joint pain and mobility.  She reportedly underwent echocardiogram at that time, that do not show any significant abnormalities.  She has now been referred to Molina rheumatologist, but has not established care with them yet.  Patient has had hypertension for several years, and has been on metoprolol-HCTZ 50-25 mg daily.  Blood pressure remains elevated.  She has significant family history of coronary artery disease with her father having had an MI at age 32.  Her paternal grandfather has also had several cardiac surgeries.   Past Medical History:  Diagnosis Date  . Ehlers-Danlos disease      Past Surgical History:  Procedure Laterality Date  . CERVICAL BIOPSY       Social History   Socioeconomic History  . Marital status: Married    Spouse name: Not on file  . Number of children: 3  . Years of education: Not on file  . Highest education level: Not on file  Occupational History  . Not on file  Social Needs  . Financial resource strain: Not on file  . Food insecurity    Worry: Not on file    Inability: Not on file  . Transportation needs    Medical: Not on file    Non-medical: Not on file  Tobacco Use  . Smoking status: Never Smoker   . Smokeless tobacco: Never Used  Substance and Sexual Activity  . Alcohol use: Not Currently  . Drug use: Never  . Sexual activity: Yes    Partners: Male    Birth control/protection: I.U.D.  Lifestyle  . Physical activity    Days per week: Not on file    Minutes per session: Not on file  . Stress: Not on file  Relationships  . Social Musicianconnections    Talks on phone: Not on file    Gets together: Not on file    Attends religious service: Not on file    Active member of club or organization: Not on file    Attends meetings of clubs or organizations: Not on file    Relationship status: Not on file  . Intimate partner violence    Fear of current or ex partner: Not on file    Emotionally abused: Not on file    Physically abused: Not on file    Forced sexual activity: Not on file  Other Topics Concern  . Not on file  Social History Narrative  . Not on file     Family History  Problem Relation Age of Onset  . Hypertension Mother   . Diabetes type II Mother   . CAD Father 2047  . Heart attack Father   . Diabetes type II Father      Current Outpatient Medications on  File Prior to Visit  Medication Sig Dispense Refill  . acetaminophen (TYLENOL) 325 MG tablet Take 650 mg by mouth every 6 (six) hours as needed.    . DULoxetine HCl 40 MG CPEP TAKE 1 CAPSULE BY MOUTH EVERY DAY 90 capsule 1  . ibuprofen (ADVIL) 200 MG tablet Take 200 mg by mouth every 6 (six) hours as needed.    . metoprolol-hydrochlorothiazide (LOPRESSOR HCT) 50-25 MG tablet TAKE 1 TABLET BY MOUTH EVERY DAY 90 tablet 1   No current facility-administered medications on file prior to visit.     Cardiovascular studies:  EKG 01/12/2019: Sinus rhythm 66 bpm. Normal EKG.   Recent labs: Results for Tiffany, Molina (MRN [id number]) as of 01/12/2019 09:57  Ref. Range 06/25/2018 11:54  Sodium Latest Ref Range: 134 - 144 mmol/L 140  Potassium Latest Ref Range: 3.5 - 5.2 mmol/L 4.4  Chloride Latest Ref Range: 96 -  106 mmol/L 102  CO2 Latest Ref Range: 20 - 29 mmol/L 24  Glucose Latest Ref Range: 65 - 99 mg/dL 110 (H)  BUN Latest Ref Range: 6 - 20 mg/dL 16  Creatinine Latest Ref Range: 0.57 - 1.00 mg/dL 0.86  Calcium Latest Ref Range: 8.7 - 10.2 mg/dL 10.4 (H)  BUN/Creatinine Ratio Latest Ref Range: 9 - 23  19  Alkaline Phosphatase Latest Ref Range: 39 - 117 IU/L 54  Albumin Latest Ref Range: 3.8 - 4.8 g/dL 4.5  Albumin/Globulin Ratio Latest Ref Range: 1.2 - 2.2  2.0  AST Latest Ref Range: 0 - 40 IU/L 21  ALT Latest Ref Range: 0 - 32 IU/L 37 (H)  Total Protein Latest Ref Range: 6.0 - 8.5 g/dL 6.8  Total Bilirubin Latest Ref Range: 0.0 - 1.2 mg/dL 0.3  GFR, Est Non African American Latest Ref Range: >59 mL/min/1.73 90  GFR, Est African American Latest Ref Range: >59 mL/min/1.73 103  Total CHOL/HDL Ratio Latest Ref Range: 0.0 - 4.4 ratio 3.7  Cholesterol, Total Latest Ref Range: 100 - 199 mg/dL 191  HDL Cholesterol Latest Ref Range: >39 mg/dL 52  LDL (calc) Latest Ref Range: 0 - 99 mg/dL 86  Triglycerides Latest Ref Range: 0 - 149 mg/dL 263 (H)  VLDL Cholesterol Cal Latest Ref Range: 5 - 40 mg/dL 53 (H)  Globulin, Total Latest Ref Range: 1.5 - 4.5 g/dL 2.3   Results for Tiffany, Molina (MRN [id number]) as of 01/12/2019 09:58  Ref. Range 06/25/2018 11:54 01/05/2019 17:23  Glucose Latest Ref Range: 65 - 99 mg/dL 110 (H)   Hemoglobin A1C Latest Ref Range: 4.0 - 5.6 %  5.8 (Molina)  TSH Latest Ref Range: 0.450 - 4.500 uIU/mL 0.946     Review of Systems  Constitution: Negative for decreased appetite, malaise/fatigue, weight gain and weight loss.  HENT: Negative for congestion.   Eyes: Negative for visual disturbance.  Cardiovascular: Negative for chest pain, dyspnea on exertion, leg swelling, palpitations and syncope.  Respiratory: Negative for cough.   Endocrine: Negative for cold intolerance.  Hematologic/Lymphatic: Does not bruise/bleed easily.  Skin: Negative for itching and rash.   Musculoskeletal: Positive for joint pain. Negative for myalgias.  Gastrointestinal: Negative for abdominal pain, nausea and vomiting.  Genitourinary: Negative for dysuria.  Neurological: Negative for dizziness and weakness.  Psychiatric/Behavioral: The patient is not nervous/anxious.   All other systems reviewed and are negative.        Vitals:   01/12/19 1400 01/12/19 1406  BP: (!) 158/114 (!) 155/114  Pulse: 68 74  Temp: 97.8 F (36.6 C)  SpO2: 98% 98%     Body mass index is 35.67 kg/m. Filed Weights   01/12/19 1400  Weight: 195 lb (88.5 kg)     Objective:   Physical Exam  Constitutional: She is oriented to person, place, and time. She appears well-developed and well-nourished. No distress.  HENT:  Head: Normocephalic and atraumatic.  Eyes: Pupils are equal, round, and reactive to light. Conjunctivae are normal.  Neck: No JVD present.  Cardiovascular: Normal rate, regular rhythm and intact distal pulses.  No murmur heard. Pulmonary/Chest: Effort normal and breath sounds normal. She has no wheezes. She has no rales.  Abdominal: Soft. Bowel sounds are normal. There is no rebound.  Musculoskeletal:        General: No edema.  Lymphadenopathy:    She has no cervical adenopathy.  Neurological: She is alert and oriented to person, place, and time. No cranial nerve deficit.  Skin: Skin is warm and dry.  Psychiatric: She has Molina normal mood and affect.  Nursing note and vitals reviewed.         Assessment & Recommendations:   33 year old Caucasian female with Ehlers-Danlos syndrome, prediabetes, history of gestational diabetes, referred to establish cardiac care.  Ehlers Danlos syndrome: Will obtain echocardiogram performed in [facility].   Hypertension: Added amlodipine 5 mg daily.  Family h/o early CAD: Recommend calcium scan for risk stratification. 10 yr ASCVD risk <1% unless calcium score >0.   Thank you for referring the patient to [place]. Please feel free  to contact with any questions.  Elder Negus, MD [facility]. [place] Pager: [phone] Office: [phone] If no answer Cell [phone]

## 2019-01-19 ENCOUNTER — Telehealth: Payer: Self-pay | Admitting: Family Medicine

## 2019-01-19 NOTE — Telephone Encounter (Signed)
Pt's referral was declined due to [facility] rheumatology does not treat Elhers- Danlos Syndrome" Please advise.

## 2019-01-20 ENCOUNTER — Other Ambulatory Visit: Payer: Self-pay | Admitting: Family Medicine

## 2019-01-20 DIAGNOSIS — Q796 Ehlers-Danlos syndrome, unspecified: Secondary | ICD-10-CM

## 2019-01-20 NOTE — Telephone Encounter (Signed)
Have been sent to provider she will send new referral today

## 2019-02-27 ENCOUNTER — Encounter: Payer: Self-pay | Admitting: Family Medicine

## 2019-04-06 ENCOUNTER — Other Ambulatory Visit: Payer: Self-pay | Admitting: Cardiology

## 2019-04-06 DIAGNOSIS — I10 Essential (primary) hypertension: Secondary | ICD-10-CM

## 2019-04-08 ENCOUNTER — Ambulatory Visit: Payer: BC Managed Care – PPO | Admitting: Family Medicine

## 2019-04-10 ENCOUNTER — Ambulatory Visit: Payer: BC Managed Care – PPO | Admitting: Family Medicine

## 2019-04-13 ENCOUNTER — Ambulatory Visit: Payer: Self-pay | Admitting: Cardiology

## 2019-05-04 ENCOUNTER — Encounter: Payer: Self-pay | Admitting: Family Medicine

## 2019-05-04 ENCOUNTER — Telehealth (INDEPENDENT_AMBULATORY_CARE_PROVIDER_SITE_OTHER): Payer: BC Managed Care – PPO | Admitting: Family Medicine

## 2019-05-04 ENCOUNTER — Other Ambulatory Visit: Payer: Self-pay

## 2019-05-04 VITALS — Ht 62.0 in | Wt 195.0 lb

## 2019-05-04 DIAGNOSIS — R5383 Other fatigue: Secondary | ICD-10-CM

## 2019-05-04 DIAGNOSIS — J351 Hypertrophy of tonsils: Secondary | ICD-10-CM

## 2019-05-04 DIAGNOSIS — R0683 Snoring: Secondary | ICD-10-CM | POA: Diagnosis not present

## 2019-05-04 DIAGNOSIS — Q796 Ehlers-Danlos syndrome, unspecified: Secondary | ICD-10-CM

## 2019-05-04 MED ORDER — PREGABALIN 50 MG PO CAPS: 50.0000 mg | ORAL_CAPSULE | Freq: Every day | ORAL | 0 refills | Status: DC

## 2019-05-04 NOTE — Progress Notes (Signed)
Med side effects of duloxetine.  Change medication ~referral to Rheumatology ([facility] did not take her so she wants a different referral)

## 2019-05-04 NOTE — Progress Notes (Signed)
Telemedicine Encounter- SOAP NOTE Established Patient  This telephone encounter was conducted with the patient's (or proxy's) verbal consent via audio telecommunications: yes/no: Yes Patient was instructed to have this encounter in a suitably private space; and to only have persons present to whom they give permission to participate. In addition, patient identity was confirmed by use of name plus two identifiers (DOB and address).  I discussed the limitations, risks, security and privacy concerns of performing an evaluation and management service by telephone and the availability of in person appointments. I also discussed with the patient that there may be a patient responsible charge related to this service. The patient expressed understanding and agreed to proceed.  I spent a total of TIME; 0 MIN TO 60 MIN: 15 minutes talking with the patient or their proxy.  CC: Depression  Subjective   Tiffany Molina is a 34 y.o. established patient. Telephone visit today for  HPI  Patient reports that she is having a lot of fatigue She states that she is not sure if it is from the duloxetine or the amlodipine She states that the cost also increased to $170 for 30 day supply She states that she feels so tired that she can't tell if her mood is worse due to fatigue of the med Duloxetine not working Depression screen [facility] 2/9 05/04/2019 01/05/2019 06/27/2018  Decreased Interest 0 1 1  Down, Depressed, Hopeless 0 1 1  PHQ - 2 Score 0 2 2  Altered sleeping - 2 2  Tired, decreased energy - 3 1  Change in appetite - 0 1  Feeling bad or failure about yourself  - 0 1  Trouble concentrating - 2 3  Moving slowly or fidgety/restless - 1 0  Suicidal thoughts - 0 0  PHQ-9 Score - 10 10  Difficult doing work/chores - Very difficult Somewhat difficult      She has researched Ehlers-Danlos Specialists and found Tanya Nones MD for Rheumatology and would like to establish care.   She has a history of  large tonsils. She states that she also has congestion in her nose and can't breathe through her nose. Her husband states that the snoring is getting worse. She feels tired in the day time and is not resting well.   Patient Active Problem List   Diagnosis Date Noted  . Ehlers-Danlos syndrome 01/12/2019    Past Medical History:  Diagnosis Date  . Depression   . Ehlers-Danlos disease   . Hypertension     Current Outpatient Medications  Medication Sig Dispense Refill  . acetaminophen (TYLENOL) 325 MG tablet Take 650 mg by mouth every 6 (six) hours as needed.    Marland Kitchen amLODipine (NORVASC) 5 MG tablet TAKE 1 TABLET BY MOUTH EVERY DAY 90 tablet 1  . DULoxetine HCl 40 MG CPEP TAKE 1 CAPSULE BY MOUTH EVERY DAY 90 capsule 1  . ibuprofen (ADVIL) 200 MG tablet Take 200 mg by mouth every 6 (six) hours as needed.    . metoprolol-hydrochlorothiazide (LOPRESSOR HCT) 50-25 MG tablet TAKE 1 TABLET BY MOUTH EVERY DAY (Patient not taking: Reported on 05/04/2019) 90 tablet 1  . pregabalin (LYRICA) 50 MG capsule Take 1 capsule (50 mg total) by mouth daily. 30 capsule 0   No current facility-administered medications for this visit.    Allergies  Allergen Reactions  . Sulfur Rash    Social History   Socioeconomic History  . Marital status: Married    Spouse name: Not on file  .  Number of children: 3  . Years of education: Not on file  . Highest education level: Not on file  Occupational History  . Not on file  Tobacco Use  . Smoking status: Never Smoker  . Smokeless tobacco: Never Used  Substance and Sexual Activity  . Alcohol use: Not Currently  . Drug use: Never  . Sexual activity: Yes    Partners: Male    Birth control/protection: I.U.D.  Other Topics Concern  . Not on file  Social History Narrative  . Not on file   Social Determinants of Health   Financial Resource Strain:   . Difficulty of Paying Living Expenses: Not on file  Food Insecurity:   . Worried About Programme researcher, broadcasting/film/video  in the Last Year: Not on file  . Ran Out of Food in the Last Year: Not on file  Transportation Needs:   . Lack of Transportation (Medical): Not on file  . Lack of Transportation (Non-Medical): Not on file  Physical Activity:   . Days of Exercise per Week: Not on file  . Minutes of Exercise per Session: Not on file  Stress:   . Feeling of Stress : Not on file  Social Connections:   . Frequency of Communication with Friends and Family: Not on file  . Frequency of Social Gatherings with Friends and Family: Not on file  . Attends Religious Services: Not on file  . Active Member of Clubs or Organizations: Not on file  . Attends Banker Meetings: Not on file  . Marital Status: Not on file  Intimate Partner Violence:   . Fear of Current or Ex-Partner: Not on file  . Emotionally Abused: Not on file  . Physically Abused: Not on file  . Sexually Abused: Not on file    ROS Review of Systems  Constitutional: Negative for activity change, appetite change, chills and fever.  HENT: Negative for congestion, nosebleeds, trouble swallowing and voice change.   Respiratory: Negative for cough, shortness of breath and wheezing.   Gastrointestinal: Negative for diarrhea, nausea and vomiting.  Genitourinary: Negative for difficulty urinating, dysuria, flank pain and hematuria.  Musculoskeletal: Negative for back pain, joint swelling and neck pain.  Neurological: Negative for dizziness, speech difficulty, light-headedness and numbness.  See HPI. All other review of systems negative.   Objective   Vitals as reported by the patient: Today's Vitals   05/04/19 1503  Weight: 195 lb (88.5 kg)  Height: 5\' 2"  (1.575 m)   Unable to do exam   Diagnoses and all orders for this visit:  Ehlers-Danlos syndrome -     Pt referred to establish care for long term management -     Ambulatory referral to Rheumatology  Snoring Large tonsils -     Due to anatomical issues like large tonsils will  send to ENT Discussed that she may need a sleep study if her anatomy is not the issue -     Ambulatory referral to ENT  Fatigue - stop duloxetine by tapering down and start lyrica in about 2 weeks  Reviewed Goodrx pricing and sent to -     pregabalin (LYRICA) 50 MG capsule; Take 1 capsule (50 mg total) by mouth daily.     I discussed the assessment and treatment plan with the patient. The patient was provided an opportunity to ask questions and all were answered. The patient agreed with the plan and demonstrated an understanding of the instructions.   The patient was  advised to call back or seek an in-person evaluation if the symptoms worsen or if the condition fails to improve as anticipated.  I provided 15 minutes of non-face-to-face time during this encounter.  Doristine Bosworth, MD  Primary Care at [facility]

## 2019-05-04 NOTE — Patient Instructions (Signed)
° ° ° °  If you have lab work done today you will be contacted with your lab results within the next 2 weeks.  If you have not heard from us then please contact us. The fastest way to get your results is to register for My Chart. ° ° °IF you received an x-ray today, you will receive an invoice from [facility]. Please contact [facility] at [phone] with questions or concerns regarding your invoice.  ° °IF you received labwork today, you will receive an invoice from [facility]. Please contact [facility] at [phone] with questions or concerns regarding your invoice.  ° °Our billing staff will not be able to assist you with questions regarding bills from these companies. ° °You will be contacted with the lab results as soon as they are available. The fastest way to get your results is to activate your My Chart account. Instructions are located on the last page of this paperwork. If you have not heard from us regarding the results in 2 weeks, please contact this office. °  ° ° ° °

## 2019-06-01 ENCOUNTER — Other Ambulatory Visit: Payer: Self-pay

## 2019-06-01 ENCOUNTER — Telehealth (INDEPENDENT_AMBULATORY_CARE_PROVIDER_SITE_OTHER): Payer: BC Managed Care – PPO | Admitting: Family Medicine

## 2019-06-01 DIAGNOSIS — F411 Generalized anxiety disorder: Secondary | ICD-10-CM | POA: Diagnosis not present

## 2019-06-01 DIAGNOSIS — Z636 Dependent relative needing care at home: Secondary | ICD-10-CM

## 2019-06-01 MED ORDER — PREGABALIN 100 MG PO CAPS: 100.0000 mg | ORAL_CAPSULE | Freq: Every day | ORAL | 3 refills | Status: DC

## 2019-06-01 MED ORDER — BUSPIRONE HCL 5 MG PO TABS: 5.0000 mg | ORAL_TABLET | Freq: Three times a day (TID) | ORAL | 3 refills | Status: DC

## 2019-06-01 NOTE — Progress Notes (Signed)
DOXIMITY Encounter- SOAP NOTE Established Patient  This VIDEO encounter was conducted with the patient's (or proxy's) verbal consent via audio telecommunications: yes/no: Yes Patient was instructed to have this encounter in a suitably private space; and to only have persons present to whom they give permission to participate. In addition, patient identity was confirmed by use of name plus two identifiers (DOB and address).  I discussed the limitations, risks, security and privacy concerns of performing an evaluation and management service by telephone and the availability of in person appointments. I also discussed with the patient that there may be a patient responsible charge related to this service. The patient expressed understanding and agreed to proceed.  I spent a total of TIME; 0 MIN TO 60 MIN: 15 minutes talking with the patient or their proxy.  Chief Complaint  Patient presents with  . Anxiety    continues to cause problems.  No recent weight taken but pt advises she wll weigh herself before dr. Nolon Rod calls back.  Marland Kitchen GAD 7 score: 15    Subjective   Tiffany Molina is a 34 y.o. established patient. Telephone visit today for  HPI  Patient reports that she weaned off the Cymbalta and could tell her anxiety was coming back.  She waiting to start the Lyrica to see how she could feel. While off all the meds her anxiety is really high.  She states that normal encounters makes her anxious.   She started taking the Lyrica a week ago and states that her anxiety is still severe.  She reports that she des not feel any different on the Lyrica.  She states that she has been off her anxiety meds.  She gets panic attacks that last 30 minutes.  She reports that she has 3 kids and does not want to have her system shut down due to panic.   GAD 7 : Generalized Anxiety Score 06/01/2019  Nervous, Anxious, on Edge 3  Control/stop worrying 1  Worry too much - different things 1  Trouble  relaxing 3  Restless 3  Easily annoyed or irritable 3  Afraid - awful might happen 1  Total GAD 7 Score 15  Anxiety Difficulty Somewhat difficult    Depression screen [facility] 2/9 06/01/2019 05/04/2019 01/05/2019 06/27/2018  Decreased Interest 0 0 1 1  Down, Depressed, Hopeless 0 0 1 1  PHQ - 2 Score 0 0 2 2  Altered sleeping - - 2 2  Tired, decreased energy - - 3 1  Change in appetite - - 0 1  Feeling bad or failure about yourself  - - 0 1  Trouble concentrating - - 2 3  Moving slowly or fidgety/restless - - 1 0  Suicidal thoughts - - 0 0  PHQ-9 Score - - 10 10  Difficult doing work/chores - - Very difficult Somewhat difficult     Patient Active Problem List   Diagnosis Date Noted  . Ehlers-Danlos syndrome 01/12/2019    Past Medical History:  Diagnosis Date  . Depression   . Ehlers-Danlos disease   . Hypertension     Current Outpatient Medications  Medication Sig Dispense Refill  . acetaminophen (TYLENOL) 325 MG tablet Take 650 mg by mouth every 6 (six) hours as needed.    Marland Kitchen amLODipine (NORVASC) 5 MG tablet TAKE 1 TABLET BY MOUTH EVERY DAY 90 tablet 1  . ibuprofen (ADVIL) 200 MG tablet Take 200 mg by mouth every 6 (six) hours as needed.    Marland Kitchen  pregabalin (LYRICA) 50 MG capsule Take 1 capsule (50 mg total) by mouth daily. 30 capsule 0  . busPIRone (BUSPAR) 5 MG tablet Take 1 tablet (5 mg total) by mouth 3 (three) times daily. 90 tablet 3  . pregabalin (LYRICA) 100 MG capsule Take 1 capsule (100 mg total) by mouth daily. 30 capsule 3   No current facility-administered medications for this visit.    Allergies  Allergen Reactions  . Sulfur Rash    Social History   Socioeconomic History  . Marital status: Married    Spouse name: Not on file  . Number of children: 3  . Years of education: Not on file  . Highest education level: Not on file  Occupational History  . Not on file  Tobacco Use  . Smoking status: Never Smoker  . Smokeless tobacco: Never Used  Substance and  Sexual Activity  . Alcohol use: Not Currently  . Drug use: Never  . Sexual activity: Yes    Partners: Male    Birth control/protection: I.U.D.  Other Topics Concern  . Not on file  Social History Narrative  . Not on file   Social Determinants of Health   Financial Resource Strain:   . Difficulty of Paying Living Expenses: Not on file  Food Insecurity:   . Worried About Programme researcher, broadcasting/film/video in the Last Year: Not on file  . Ran Out of Food in the Last Year: Not on file  Transportation Needs:   . Lack of Transportation (Medical): Not on file  . Lack of Transportation (Non-Medical): Not on file  Physical Activity:   . Days of Exercise per Week: Not on file  . Minutes of Exercise per Session: Not on file  Stress:   . Feeling of Stress : Not on file  Social Connections:   . Frequency of Communication with Friends and Family: Not on file  . Frequency of Social Gatherings with Friends and Family: Not on file  . Attends Religious Services: Not on file  . Active Member of Clubs or Organizations: Not on file  . Attends Banker Meetings: Not on file  . Marital Status: Not on file  Intimate Partner Violence:   . Fear of Current or Ex-Partner: Not on file  . Emotionally Abused: Not on file  . Physically Abused: Not on file  . Sexually Abused: Not on file    ROS Review of Systems  Constitutional: Negative for activity change, appetite change, chills and fever.  HENT: Negative for congestion, nosebleeds, trouble swallowing and voice change.   Respiratory: Negative for cough, shortness of breath and wheezing.   Gastrointestinal: Negative for diarrhea, nausea and vomiting.  Genitourinary: Negative for difficulty urinating, dysuria, flank pain and hematuria.  Musculoskeletal: Negative for back pain, joint swelling and neck pain.  Neurological: Negative for dizziness, speech difficulty, light-headedness and numbness.  See HPI. All other review of systems negative.    Objective   Vitals as reported by the patient: There were no vitals filed for this visit.   Physical Exam  Constitutional: Oriented to person, place, and time. Appears well-developed and well-nourished.  HENT:  Head: Normocephalic and atraumatic.  Eyes: Conjunctivae and EOM are normal.  Pulmonary/Chest: Effort normal without distress Neurological: Is alert and oriented to person, place, and time.  Skin: Skin is warm. Capillary refill takes less than 2 seconds.  Psychiatric: Has a normal mood and affect. Behavior is normal. Judgment and thought content normal.    Safaa was seen today  for anxiety and gad 7 score: 15.  Diagnoses and all orders for this visit:  GAD (generalized anxiety disorder) -     pregabalin (LYRICA) 100 MG capsule; Take 1 capsule (100 mg total) by mouth daily.  Caregiver stress  Other orders -     busPIRone (BUSPAR) 5 MG tablet; Take 1 tablet (5 mg total) by mouth 3 (three) times daily.   Discussed increasing Lyrica from 50mg  to 100mg   Advised pt to use buspar for anxiety episodes until her lyrica has stabilized.     I discussed the assessment and treatment plan with the patient. The patient was provided an opportunity to ask questions and all were answered. The patient agreed with the plan and demonstrated an understanding of the instructions.   The patient was advised to call back or seek an in-person evaluation if the symptoms worsen or if the condition fails to improve as anticipated.  I provided 15 minutes of VIDEO face-to-face time during this encounter.  , MD  Primary Care at [facility]

## 2019-06-01 NOTE — Patient Instructions (Signed)
° ° ° °  If you have lab work done today you will be contacted with your lab results within the next 2 weeks.  If you have not heard from us then please contact us. The fastest way to get your results is to register for My Chart. ° ° °IF you received an x-ray today, you will receive an invoice from [facility]. Please contact [facility] at [phone] with questions or concerns regarding your invoice.  ° °IF you received labwork today, you will receive an invoice from [facility]. Please contact [facility] at [phone] with questions or concerns regarding your invoice.  ° °Our billing staff will not be able to assist you with questions regarding bills from these companies. ° °You will be contacted with the lab results as soon as they are available. The fastest way to get your results is to activate your My Chart account. Instructions are located on the last page of this paperwork. If you have not heard from us regarding the results in 2 weeks, please contact this office. °  ° ° ° °

## 2019-06-08 ENCOUNTER — Other Ambulatory Visit: Payer: Self-pay | Admitting: Family Medicine

## 2019-06-08 NOTE — Telephone Encounter (Signed)
Patient is requesting a refill of the following medications: Requested Prescriptions   Pending Prescriptions Disp Refills  . pregabalin (LYRICA) 50 MG capsule 30 capsule 0    Sig: Take 1 capsule (50 mg total) by mouth daily.  this medication was sent to the wrong pharmacy. The medication is to be [facility] to the [facility] due to cost. Please rsend

## 2019-06-08 NOTE — Telephone Encounter (Signed)
Copied from CRM [id number]. Topic: Quick Communication - Rx Refill/Question >> Jun 08, 2019 12:38 PM Jaquita Rector A wrote: Medication: pregabalin (LYRICA) 100 MG capsule  Rx sent to [facility] on 06/01/19 please send to [facility]  Has the patient contacted their pharmacy? Yes.   (Agent: If no, request that the patient contact the pharmacy for the refill.) (Agent: If yes, when and what did the pharmacy advise?)  Preferred Pharmacy (with phone number or street name): [facility] [address] - [place], [place] - [address]  Phone:  [phone] Fax:  [phone]     Agent: Please be advised that RX refills may take up to 3 business days. We ask that you follow-up with your pharmacy.

## 2019-06-08 NOTE — Telephone Encounter (Signed)
Requested medication (s) are due for refill today: yes  Requested medication (s) are on the active medication list: yes  Last refill:  06/01/19  Future visit scheduled: no  Notes to clinic:  pt would like to have prescription sent to [facility]   Requested Prescriptions  Pending Prescriptions Disp Refills   pregabalin (LYRICA) 50 MG capsule 30 capsule 0    Sig: Take 1 capsule (50 mg total) by mouth daily.      Not Delegated - Neurology:  Anticonvulsants - Controlled Failed - 06/08/2019 12:55 PM      Failed - This refill cannot be delegated      Passed - Valid encounter within last 12 months    Recent Outpatient Visits           1 week ago GAD (generalized anxiety disorder)   Primary Care at [facility], Manus Rudd, MD   1 month ago Ehlers-Danlos syndrome   Primary Care at [facility], Manus Rudd, MD   5 months ago Hyperglycemia   Primary Care at [facility], [place] A, MD   11 months ago Current moderate episode of major depressive disorder without prior episode [facility])   Primary Care at [facility], Manus Rudd, MD   11 months ago Screening for metabolic disorder   Primary Care at [facility], Manus Rudd, MD

## 2019-06-09 MED ORDER — PREGABALIN 50 MG PO CAPS: 50.0000 mg | ORAL_CAPSULE | Freq: Every day | ORAL | 1 refills | Status: DC

## 2019-07-07 ENCOUNTER — Telehealth: Payer: Self-pay | Admitting: Family Medicine

## 2019-07-07 NOTE — Telephone Encounter (Signed)
Pt would like a new referral for a cardiologist. She saw Dr. Jacinto Halim before for this issue. She would like a referral for same issue with a female provider. Please advise at [phone].

## 2019-07-07 NOTE — Telephone Encounter (Signed)
Please Advise

## 2019-07-07 NOTE — Telephone Encounter (Signed)
Yates Decamp, MD [facility]ing [facility] Cardiology 07/07/2019 End  07/07/19  Phone: [phone]; Fax: [phone]     Please notify the patient to contact Dr. Verl Dicker office for an appointment with one of his colleagues who is female.

## 2019-07-10 ENCOUNTER — Telehealth: Payer: Self-pay | Admitting: Family Medicine

## 2019-07-10 DIAGNOSIS — Q796 Ehlers-Danlos syndrome, unspecified: Secondary | ICD-10-CM

## 2019-07-10 NOTE — Telephone Encounter (Signed)
Pt has changed her mind of going to Dr. Verl Dicker office. There is another office she would rater go to. Could not remember the name right off. Told pt to call back with the information

## 2019-07-10 NOTE — Telephone Encounter (Signed)
Pt called in to request cardiology referral to T[facility] Molina on [address]Bridget Cristal [place]  Fax: [phone]  Pt states she has spoken to Dr. Creta Levin about concern

## 2019-07-10 NOTE — Telephone Encounter (Signed)
Pt requesting Cards referral is this acceptable

## 2019-07-14 NOTE — Telephone Encounter (Signed)
Referral has been placed. 

## 2019-08-07 ENCOUNTER — Encounter: Payer: Self-pay | Admitting: General Practice

## 2019-10-01 ENCOUNTER — Other Ambulatory Visit: Payer: Self-pay | Admitting: Cardiology

## 2019-10-01 DIAGNOSIS — I10 Essential (primary) hypertension: Secondary | ICD-10-CM

## 2019-11-10 ENCOUNTER — Ambulatory Visit: Payer: Self-pay | Admitting: Surgery

## 2019-11-10 NOTE — H&P (Signed)
Subjective:  CC: Residual hemorrhoidal skin tags [K64.4]   HPI:  Tiffany Molina is a 34 y.o. female who was referrred by Enid Baas, MD for above. Symptoms were first noted several years ago. she has some rectal bleeding occurring Bleeding is described as light, rare. Pain is intermittent and discomfort, exacerbated by certain clothing    Past Medical History:  has a past medical history of Adhesions of nasal septum and turbinates, Anxiety, Depression, EDS (Ehlers-Danlos syndrome), Hypertension, Prediabetes, and Vitamin D deficiency.  Past Surgical History:  has a past surgical history that includes other surgery.  Family History: family history includes Allergic rhinitis in her sister; Diabetes type II in her father and mother; Ehlers-Danlos syndrome in her brother; High blood pressure (Hypertension) in her father and mother; Migraines in her brother; Myocardial Infarction (Heart attack) in her father; No Known Problems in her sister.  Social History:  reports that she has never smoked. She has never used smokeless tobacco. She reports previous alcohol use. She reports that she does not use drugs.  Current Medications: has a current medication list which includes the following prescription(s): acetaminophen, amlodipine, buspirone, cholecalciferol, ibuprofen, levonorgestrel, and magnesium.  Allergies:       Allergies as of 11/09/2019 - Reviewed 11/09/2019  Allergen Reaction Noted  . Sulfa (sulfonamide antibiotics) Rash 10/06/2019    ROS:  A 15 point review of systems was performed and pertinent positives and negatives noted in HPI  Objective:   BP 138/82   Pulse 91   Temp 36.5 C (97.7 F) (Oral)   Ht 157.5 cm (5\' 2" )   Wt 89.8 kg (198 lb)   BMI 36.21 kg/m   Constitutional :  alert, appears stated age, cooperative and no distress  Lymphatics/Throat::  no asymmetry, masses, or scars  Respiratory:  clear to auscultation bilaterally  Cardiovascular:  regular  rate and rhythm  Gastrointestinal: soft, non-tender; bowel sounds normal; no masses,  no organomegaly.    Musculoskeletal: Steady gait and movement  Skin: Cool and moist  Psychiatric: Normal affect, non-agitated, not confused  Genital/Rectal: Chaperone present for exam.  External exam noted to have right anterior skin tag, non-tender, no ulceration.  DRE revealed, normal rectal tone, with palpable internal hemorrhoid noted at anterior portion with minimal discomfort.  After obtaining verbal consent, an anoscope was inserted after prepped with lubricant and internal hemorrhoids noted on DRE confirmed visually, along with Left lateral internal hemorrhoids with no evidence of thrombosis. No other pathology such as fissures, fistulas, polyps noted.  Scope withdrawn and patient tolerate procedure well.     LABS:  n/a   RADS: n/a Assessment:   Residual hemorrhoidal skin tags [K64.4]  Grade I internal hemorrhoids Plan:  1. Residual hemorrhoidal skin tags [K64.4] Discussed risks/benefits/alternatives to surgery.  Alternatives include the options of observation, medical management.  Benefits include symptomatic relief.  I discussed  in detail and the complications related to the operation and the anesthesia, including bleeding, infection, recurrence, remote possibility of temporary or permanent fecal incontinence, poor/delayed wound healing, chronic pain, and additional procedures to address said risks. The risks of general anesthetic, if used, includes MI, CVA, sudden death or even reaction to anesthetic medications also discussed.   We also discussed typical post operative recovery which includes weeks to potentially months of anal pain, drainage, occasional bleeding, and sense of fecal urgency.    ED return precautions given for sudden increase in pain, bleeding, with possible accompanying fever, nausea, and/or vomiting.  The patient understands the risks, any  and all questions were answered to  the patient's satisfaction.  2. Patient has elected to proceed with hemorrhoidectomy and skin tag removal despite minimal complaints.  She verbalized understanding of risks above and still wishes to proceed.  Procedure will be scheduled.  NO guaranteed cosmetic outcome and risk of recurrence also discussed  

## 2019-11-10 NOTE — H&P (View-Only) (Signed)
Subjective:  CC: Residual hemorrhoidal skin tags [K64.4]   HPI:  Tiffany Molina is a 34 y.o. female who was referrred by Enid Baas, MD for above. Symptoms were first noted several years ago. she has some rectal bleeding occurring Bleeding is described as light, rare. Pain is intermittent and discomfort, exacerbated by certain clothing    Past Medical History:  has a past medical history of Adhesions of nasal septum and turbinates, Anxiety, Depression, EDS (Ehlers-Danlos syndrome), Hypertension, Prediabetes, and Vitamin D deficiency.  Past Surgical History:  has a past surgical history that includes other surgery.  Family History: family history includes Allergic rhinitis in her sister; Diabetes type II in her father and mother; Ehlers-Danlos syndrome in her brother; High blood pressure (Hypertension) in her father and mother; Migraines in her brother; Myocardial Infarction (Heart attack) in her father; No Known Problems in her sister.  Social History:  reports that she has never smoked. She has never used smokeless tobacco. She reports previous alcohol use. She reports that she does not use drugs.  Current Medications: has a current medication list which includes the following prescription(s): acetaminophen, amlodipine, buspirone, cholecalciferol, ibuprofen, levonorgestrel, and magnesium.  Allergies:       Allergies as of 11/09/2019 - Reviewed 11/09/2019  Allergen Reaction Noted  . Sulfa (sulfonamide antibiotics) Rash 10/06/2019    ROS:  A 15 point review of systems was performed and pertinent positives and negatives noted in HPI  Objective:   BP 138/82   Pulse 91   Temp 36.5 C (97.7 F) (Oral)   Ht 157.5 cm (5\' 2" )   Wt 89.8 kg (198 lb)   BMI 36.21 kg/m   Constitutional :  alert, appears stated age, cooperative and no distress  Lymphatics/Throat::  no asymmetry, masses, or scars  Respiratory:  clear to auscultation bilaterally  Cardiovascular:  regular  rate and rhythm  Gastrointestinal: soft, non-tender; bowel sounds normal; no masses,  no organomegaly.    Musculoskeletal: Steady gait and movement  Skin: Cool and moist  Psychiatric: Normal affect, non-agitated, not confused  Genital/Rectal: Chaperone present for exam.  External exam noted to have right anterior skin tag, non-tender, no ulceration.  DRE revealed, normal rectal tone, with palpable internal hemorrhoid noted at anterior portion with minimal discomfort.  After obtaining verbal consent, an anoscope was inserted after prepped with lubricant and internal hemorrhoids noted on DRE confirmed visually, along with Left lateral internal hemorrhoids with no evidence of thrombosis. No other pathology such as fissures, fistulas, polyps noted.  Scope withdrawn and patient tolerate procedure well.     LABS:  n/a   RADS: n/a Assessment:   Residual hemorrhoidal skin tags [K64.4]  Grade I internal hemorrhoids Plan:  1. Residual hemorrhoidal skin tags [K64.4] Discussed risks/benefits/alternatives to surgery.  Alternatives include the options of observation, medical management.  Benefits include symptomatic relief.  I discussed  in detail and the complications related to the operation and the anesthesia, including bleeding, infection, recurrence, remote possibility of temporary or permanent fecal incontinence, poor/delayed wound healing, chronic pain, and additional procedures to address said risks. The risks of general anesthetic, if used, includes MI, CVA, sudden death or even reaction to anesthetic medications also discussed.   We also discussed typical post operative recovery which includes weeks to potentially months of anal pain, drainage, occasional bleeding, and sense of fecal urgency.    ED return precautions given for sudden increase in pain, bleeding, with possible accompanying fever, nausea, and/or vomiting.  The patient understands the risks, any  and all questions were answered to  the patient's satisfaction.  2. Patient has elected to proceed with hemorrhoidectomy and skin tag removal despite minimal complaints.  She verbalized understanding of risks above and still wishes to proceed.  Procedure will be scheduled.  NO guaranteed cosmetic outcome and risk of recurrence also discussed

## 2019-11-13 ENCOUNTER — Other Ambulatory Visit: Payer: Self-pay

## 2019-11-13 ENCOUNTER — Encounter
Admission: RE | Admit: 2019-11-13 | Discharge: 2019-11-13 | Disposition: A | Discharge status: Home / Self Care | Payer: Managed Care, Other (non HMO) | Source: Ambulatory Visit | Attending: Surgery | Admitting: Surgery

## 2019-11-13 HISTORY — DX: Anxiety disorder, unspecified: F41.9

## 2019-11-13 HISTORY — DX: Other specified disorders of nose and nasal sinuses: J34.89

## 2019-11-13 HISTORY — DX: Low back pain, unspecified: M54.50

## 2019-11-13 HISTORY — DX: Prediabetes: R73.03

## 2019-11-13 NOTE — Patient Instructions (Signed)
COVID TESTING Date: November 17, 2019 TUESDAY Testing site:  [facility] - [address] Thru Hours:  0:86 am - 1:00 pm Once you are tested, you are asked to stay quarantined (avoiding public places) until after your surgery.   Your procedure is scheduled on: Thursday November 19, 2019 Report to Day Surgery on the 2nd floor of the Medical Mall. To find out your arrival time, please call [phone] between 1PM - 3PM on: November 18, 2019  REMEMBER: Instructions that are not followed completely may result in serious medical risk, up to and including death; or upon the discretion of your surgeon and anesthesiologist your surgery may need to be rescheduled.  Do not eat food after midnight the night before surgery.  No gum chewing, lozengers or hard candies.  You may however, drink CLEAR liquids up to 2 hours before you are scheduled to arrive for your surgery. Do not drink anything within 2 hours of your scheduled arrival time.  Clear liquids include: - water  - apple juice without pulp - gatorade (not RED) - black coffee or tea (Do NOT add milk or creamers to the coffee or tea) Do NOT drink anything that is not on this list.  Type 1 and Type 2 diabetics should only drink water.   TAKE THESE MEDICATIONS THE MORNING OF SURGERY WITH A SIP OF WATER: AMLODIPINE BUSPIRONE OMEPRAZOLE (take one the night before and one on the morning of surgery - helps to prevent nausea after surgery.)  Follow recommendations from Cardiologist, Pulmonologist or PCP regarding stopping Aspirin, Coumadin, Plavix, Eliquis, Pradaxa, or Pletal.  Stop Anti-inflammatories (NSAIDS) such as Advil, Aleve, Ibuprofen, Motrin, Naproxen, Naprosyn and Aspirin based products such as Excedrin, Goodys Powder, BC Powder. (May take Tylenol or Acetaminophen if needed.)  Stop ANY OVER THE COUNTER supplements until after surgery. (May continue Vitamin D, Vitamin B, and multivitamin and  magnesium)  No Alcohol for 24 hours before or after surgery.  No Smoking including e-cigarettes for 24 hours prior to surgery.  No chewable tobacco products for at least 6 hours prior to surgery.  No nicotine patches on the day of surgery.  Do not use any "recreational" drugs for at least a week prior to your surgery.  Please be advised that the combination of cocaine and anesthesia may have negative outcomes, up to and including death. If you test positive for cocaine, your surgery will be cancelled.  On the morning of surgery brush your teeth with toothpaste and water, you may rinse your mouth with mouthwash if you wish. Do not swallow any toothpaste or mouthwash.  Do not wear jewelry, make-up, hairpins, clips or nail polish.  Do not wear lotions, powders, or perfumes.   Do not shave 48 hours prior to surgery.   Contact lenses, hearing aids and dentures may not be worn into surgery.  Do not bring valuables to the hospital. [facility] is not responsible for any missing/lost belongings or valuables.    Notify your doctor if there is any change in your medical condition (cold, fever, infection).  Wear comfortable clothing (specific to your surgery type) to the hospital.  Plan for stool softeners for home use; pain medications have a tendency to cause constipation. You can also help prevent constipation by eating foods high in fiber such as fruits and vegetables and drinking plenty of fluids as your diet allows.  After surgery, you can help prevent lung complications by doing breathing exercises.  Take deep breaths and cough  every 1-2 hours. Your doctor may order a device called an Incentive Spirometer to help you take deep breaths. When coughing or sneezing, hold a pillow firmly against your incision with both hands. This is called splinting. Doing this helps protect your incision. It also decreases belly discomfort.  If you are being discharged the day of surgery, you will not  be allowed to drive home. You will need a responsible adult (18 years or older) to drive you home and stay with you that night.    Please call the Pre-admissions Testing Dept. at [phone] if you have any questions about these instructions.  Visitation Policy:  Patients undergoing a surgery or procedure may have one family member or support person with them as long as that person is not COVID-19 positive or experiencing its symptoms.  That person may remain in the waiting area during the procedure.  Children under 22 years of age may have both parents or legal guardians with them during their procedure.  Inpatient Visitation Update:   In an effort to ensure the safety of our team members and our patients, we are implementing a change to our visitation policy:  Effective Monday, Aug. 9, at 7 a.m., inpatients will be allowed one support person.  o The support person may change daily.  o The support person must pass our screening, gel in and out, and wear a mask at all times, including in the patients room.  o Patients must also wear a mask when staff or their support person are in the room.  o Masking is required regardless of vaccination status.  Systemwide, no visitors 17 or younger.

## 2019-11-17 ENCOUNTER — Other Ambulatory Visit
Admission: RE | Admit: 2019-11-17 | Discharge: 2019-11-17 | Disposition: A | Discharge status: Home / Self Care | Payer: Managed Care, Other (non HMO) | Source: Ambulatory Visit | Attending: Surgery | Admitting: Surgery

## 2019-11-17 ENCOUNTER — Other Ambulatory Visit: Payer: Self-pay

## 2019-11-17 DIAGNOSIS — Z01818 Encounter for other preprocedural examination: Secondary | ICD-10-CM | POA: Insufficient documentation

## 2019-11-17 DIAGNOSIS — I10 Essential (primary) hypertension: Secondary | ICD-10-CM | POA: Diagnosis not present

## 2019-11-17 DIAGNOSIS — Z20822 Contact with and (suspected) exposure to covid-19: Secondary | ICD-10-CM | POA: Diagnosis not present

## 2019-11-17 LAB — BASIC METABOLIC PANEL
Anion gap: 9 (ref 5–15)
BUN: 16 mg/dL (ref 6–20)
CO2: 26 mmol/L (ref 22–32)
Calcium: 10.2 mg/dL (ref 8.9–10.3)
Chloride: 103 mmol/L (ref 98–111)
Creatinine, Ser: 0.84 mg/dL (ref 0.44–1.00)
GFR calc Af Amer: >60 mL/min (ref >60)
GFR calc non Af Amer: >60 mL/min (ref >60)
Glucose, Bld: 123 mg/dL — ABNORMAL HIGH (ref 70–99)
Potassium: 3.3 mmol/L — ABNORMAL LOW (ref 3.5–5.1)
Sodium: 138 mmol/L (ref 135–145)

## 2019-11-17 LAB — SARS CORONAVIRUS 2 (TAT 6-24 HRS): SARS Coronavirus 2: NEGATIVE

## 2019-11-18 MED ORDER — PROPOFOL 500 MG/50ML IV EMUL
INTRAVENOUS | Status: AC
  Filled 2019-11-18: qty 250

## 2019-11-19 ENCOUNTER — Ambulatory Visit
Admission: RE | Admit: 2019-11-19 | Discharge: 2019-11-19 | Disposition: A | Discharge status: Home / Self Care | Payer: Managed Care, Other (non HMO) | Attending: Surgery | Admitting: Surgery

## 2019-11-19 ENCOUNTER — Ambulatory Visit: Payer: Managed Care, Other (non HMO) | Admitting: Registered Nurse

## 2019-11-19 ENCOUNTER — Encounter: Payer: Self-pay | Admitting: Surgery

## 2019-11-19 ENCOUNTER — Other Ambulatory Visit: Payer: Self-pay

## 2019-11-19 ENCOUNTER — Encounter
Admission: RE | Disposition: A | Discharge status: Home / Self Care | Payer: Self-pay | Source: Home / Self Care | Attending: Surgery

## 2019-11-19 DIAGNOSIS — Z882 Allergy status to sulfonamides status: Secondary | ICD-10-CM | POA: Insufficient documentation

## 2019-11-19 DIAGNOSIS — I10 Essential (primary) hypertension: Secondary | ICD-10-CM | POA: Insufficient documentation

## 2019-11-19 DIAGNOSIS — K644 Residual hemorrhoidal skin tags: Secondary | ICD-10-CM | POA: Diagnosis not present

## 2019-11-19 DIAGNOSIS — Z791 Long term (current) use of non-steroidal anti-inflammatories (NSAID): Secondary | ICD-10-CM | POA: Insufficient documentation

## 2019-11-19 DIAGNOSIS — K642 Third degree hemorrhoids: Secondary | ICD-10-CM | POA: Insufficient documentation

## 2019-11-19 DIAGNOSIS — Z793 Long term (current) use of hormonal contraceptives: Secondary | ICD-10-CM | POA: Diagnosis not present

## 2019-11-19 DIAGNOSIS — Z79899 Other long term (current) drug therapy: Secondary | ICD-10-CM | POA: Insufficient documentation

## 2019-11-19 DIAGNOSIS — Q796 Ehlers-Danlos syndrome, unspecified: Secondary | ICD-10-CM | POA: Diagnosis not present

## 2019-11-19 DIAGNOSIS — F419 Anxiety disorder, unspecified: Secondary | ICD-10-CM | POA: Insufficient documentation

## 2019-11-19 HISTORY — PX: HEMORRHOID SURGERY: SHX153

## 2019-11-19 LAB — POCT PREGNANCY, URINE: Preg Test, Ur: NEGATIVE

## 2019-11-19 SURGERY — HEMORRHOIDECTOMY
Anesthesia: General | Site: Rectum

## 2019-11-19 MED ORDER — OXYCODONE HCL 5 MG/5ML PO SOLN: 5.0000 mg | Freq: Once | ORAL | Status: DC | PRN

## 2019-11-19 MED ORDER — HYDROCODONE-ACETAMINOPHEN 5-325 MG PO TABS
1.0000 | ORAL_TABLET | Freq: Four times a day (QID) | ORAL | 0 refills | Status: AC | PRN
Start: 2019-11-19 — End: ?

## 2019-11-19 MED ORDER — ACETAMINOPHEN 500 MG PO TABS
ORAL_TABLET | ORAL | Status: AC
  Administered 2019-11-19: 1000 mg via ORAL
  Filled 2019-11-19: qty 2

## 2019-11-19 MED ORDER — CELECOXIB 200 MG PO CAPS: 200.0000 mg | ORAL_CAPSULE | ORAL | Status: AC

## 2019-11-19 MED ORDER — DOCUSATE SODIUM 100 MG PO CAPS: 100.0000 mg | ORAL_CAPSULE | Freq: Two times a day (BID) | ORAL | 0 refills | Status: AC | PRN

## 2019-11-19 MED ORDER — LIDOCAINE HCL (PF) 2 % IJ SOLN
INTRAMUSCULAR | Status: AC
  Filled 2019-11-19: qty 5

## 2019-11-19 MED ORDER — CELECOXIB 200 MG PO CAPS
ORAL_CAPSULE | ORAL | Status: AC
  Administered 2019-11-19: 200 mg via ORAL
  Filled 2019-11-19: qty 1

## 2019-11-19 MED ORDER — PROPOFOL 10 MG/ML IV BOLUS
INTRAVENOUS | Status: AC
  Filled 2019-11-19: qty 20

## 2019-11-19 MED ORDER — DEXAMETHASONE SODIUM PHOSPHATE 10 MG/ML IJ SOLN
INTRAMUSCULAR | Status: AC
  Filled 2019-11-19: qty 1

## 2019-11-19 MED ORDER — ORAL CARE MOUTH RINSE: 15.0000 mL | Freq: Once | OROMUCOSAL | Status: AC

## 2019-11-19 MED ORDER — KETOROLAC TROMETHAMINE 30 MG/ML IJ SOLN
INTRAMUSCULAR | Status: AC
  Filled 2019-11-19: qty 1

## 2019-11-19 MED ORDER — ONDANSETRON HCL 4 MG/2ML IJ SOLN
INTRAMUSCULAR | Status: DC | PRN
  Administered 2019-11-19: 4 mg via INTRAVENOUS

## 2019-11-19 MED ORDER — BUPIVACAINE LIPOSOME 1.3 % IJ SUSP
INTRAMUSCULAR | Status: DC | PRN
  Administered 2019-11-19: 20 mL

## 2019-11-19 MED ORDER — IBUPROFEN 800 MG PO TABS: 800.0000 mg | ORAL_TABLET | Freq: Three times a day (TID) | ORAL | 0 refills | Status: AC | PRN

## 2019-11-19 MED ORDER — PROPOFOL 10 MG/ML IV BOLUS
INTRAVENOUS | Status: AC
  Filled 2019-11-19: qty 40

## 2019-11-19 MED ORDER — MIDAZOLAM HCL 2 MG/2ML IJ SOLN
INTRAMUSCULAR | Status: AC
  Filled 2019-11-19: qty 2

## 2019-11-19 MED ORDER — LACTATED RINGERS IV SOLN: INTRAVENOUS | Status: DC

## 2019-11-19 MED ORDER — PROMETHAZINE HCL 25 MG/ML IJ SOLN: 6.2500 mg | INTRAMUSCULAR | Status: DC | PRN

## 2019-11-19 MED ORDER — CHLORHEXIDINE GLUCONATE CLOTH 2 % EX PADS
6.0000 | MEDICATED_PAD | Freq: Once | CUTANEOUS | Status: AC
  Administered 2019-11-19: 6 via TOPICAL

## 2019-11-19 MED ORDER — FENTANYL CITRATE (PF) 100 MCG/2ML IJ SOLN
INTRAMUSCULAR | Status: AC
  Filled 2019-11-19: qty 2

## 2019-11-19 MED ORDER — LIDOCAINE HCL (CARDIAC) PF 100 MG/5ML IV SOSY
PREFILLED_SYRINGE | INTRAVENOUS | Status: DC | PRN
  Administered 2019-11-19: 80 mg via INTRAVENOUS

## 2019-11-19 MED ORDER — CHLORHEXIDINE GLUCONATE 0.12 % MT SOLN
OROMUCOSAL | Status: AC
  Administered 2019-11-19: 15 mL via OROMUCOSAL
  Filled 2019-11-19: qty 15

## 2019-11-19 MED ORDER — CHLORHEXIDINE GLUCONATE 0.12 % MT SOLN: 15.0000 mL | Freq: Once | OROMUCOSAL | Status: AC

## 2019-11-19 MED ORDER — SEVOFLURANE IN SOLN
RESPIRATORY_TRACT | Status: AC
  Filled 2019-11-19: qty 250

## 2019-11-19 MED ORDER — GELATIN ABSORBABLE 12-7 MM EX MISC
CUTANEOUS | Status: AC
  Filled 2019-11-19: qty 1

## 2019-11-19 MED ORDER — ONDANSETRON HCL 4 MG/2ML IJ SOLN
INTRAMUSCULAR | Status: AC
  Filled 2019-11-19: qty 2

## 2019-11-19 MED ORDER — BUPIVACAINE-EPINEPHRINE (PF) 0.5% -1:200000 IJ SOLN
INTRAMUSCULAR | Status: DC | PRN
  Administered 2019-11-19: 5 mL

## 2019-11-19 MED ORDER — OXYCODONE HCL 5 MG PO TABS: 5.0000 mg | ORAL_TABLET | Freq: Once | ORAL | Status: DC | PRN

## 2019-11-19 MED ORDER — BUPIVACAINE-EPINEPHRINE (PF) 0.5% -1:200000 IJ SOLN
INTRAMUSCULAR | Status: AC
  Filled 2019-11-19: qty 30

## 2019-11-19 MED ORDER — FENTANYL CITRATE (PF) 100 MCG/2ML IJ SOLN: 25.0000 ug | INTRAMUSCULAR | Status: DC | PRN

## 2019-11-19 MED ORDER — DEXAMETHASONE SODIUM PHOSPHATE 10 MG/ML IJ SOLN
INTRAMUSCULAR | Status: DC | PRN
  Administered 2019-11-19: 10 mg via INTRAVENOUS

## 2019-11-19 MED ORDER — BUPIVACAINE LIPOSOME 1.3 % IJ SUSP
INTRAMUSCULAR | Status: AC
  Filled 2019-11-19: qty 20

## 2019-11-19 MED ORDER — PROPOFOL 10 MG/ML IV BOLUS
INTRAVENOUS | Status: DC | PRN
  Administered 2019-11-19: 90 mg via INTRAVENOUS
  Administered 2019-11-19: 50 mg via INTRAVENOUS
  Administered 2019-11-19: 100 mg via INTRAVENOUS
  Administered 2019-11-19: 160 mg via INTRAVENOUS

## 2019-11-19 MED ORDER — ACETAMINOPHEN 500 MG PO TABS: 1000.0000 mg | ORAL_TABLET | ORAL | Status: AC

## 2019-11-19 MED ORDER — FENTANYL CITRATE (PF) 100 MCG/2ML IJ SOLN
INTRAMUSCULAR | Status: DC | PRN
Start: 2019-11-19 — End: 2019-11-19
  Administered 2019-11-19: 50 ug via INTRAVENOUS
  Administered 2019-11-19 (×2): 25 ug via INTRAVENOUS

## 2019-11-19 MED ORDER — LIDOCAINE 5 % EX OINT: 1.0000 "application " | TOPICAL_OINTMENT | Freq: Three times a day (TID) | CUTANEOUS | 0 refills | Status: AC | PRN

## 2019-11-19 MED ORDER — MIDAZOLAM HCL 2 MG/2ML IJ SOLN
INTRAMUSCULAR | Status: DC | PRN
  Administered 2019-11-19: 2 mg via INTRAVENOUS

## 2019-11-19 MED ORDER — ACETAMINOPHEN 325 MG PO TABS: 650.0000 mg | ORAL_TABLET | Freq: Three times a day (TID) | ORAL | 0 refills | Status: AC | PRN

## 2019-11-19 SURGICAL SUPPLY — 32 items
BLADE SURG 15 STRL LF DISP TIS (BLADE) ×1 IMPLANT
BLADE SURG 15 STRL SS (BLADE) ×2
BRIEF STRETCH MATERNITY 2XLG (MISCELLANEOUS) ×2 IMPLANT
CANISTER SUCT 1200ML W/VALVE (MISCELLANEOUS) ×2 IMPLANT
COVER WAND RF STERILE (DRAPES) ×2 IMPLANT
DRAPE PERI LITHO V/GYN (MISCELLANEOUS) ×2 IMPLANT
DRAPE UNDER BUTTOCK W/FLU (DRAPES) ×2 IMPLANT
DRSG GAUZE FLUFF 36X18 (GAUZE/BANDAGES/DRESSINGS) ×2 IMPLANT
ELECT REM PT RETURN 9FT ADLT (ELECTROSURGICAL) ×2
ELECTRODE REM PT RTRN 9FT ADLT (ELECTROSURGICAL) ×1 IMPLANT
GLOVE BIOGEL PI IND STRL 7.0 (GLOVE) ×1 IMPLANT
GLOVE BIOGEL PI INDICATOR 7.0 (GLOVE) ×4
GLOVE SURG SYN 6.5 ES PF (GLOVE) ×2 IMPLANT
GLOVE SURG SYN 6.5 PF PI (GLOVE) ×1 IMPLANT
GOWN STRL REUS W/ TWL LRG LVL3 (GOWN DISPOSABLE) ×2 IMPLANT
GOWN STRL REUS W/TWL LRG LVL3 (GOWN DISPOSABLE) ×4
KIT TURNOVER CYSTO (KITS) ×2 IMPLANT
LABEL OR SOLS (LABEL) ×2 IMPLANT
NDL HYPO 25X1 1.5 SAFETY (NEEDLE) ×1 IMPLANT
NEEDLE HYPO 22GX1.5 SAFETY (NEEDLE) ×2 IMPLANT
NEEDLE HYPO 25X1 1.5 SAFETY (NEEDLE) ×2 IMPLANT
NS IRRIG 500ML POUR BTL (IV SOLUTION) ×2 IMPLANT
PACK BASIN MINOR (MISCELLANEOUS) ×2 IMPLANT
PAD PREP 24X41 OB/GYN DISP (PERSONAL CARE ITEMS) ×2 IMPLANT
SHEARS HARMONIC 9CM CVD (BLADE) ×1 IMPLANT
SOL PREP PVP 2OZ (MISCELLANEOUS) ×2
SOLUTION PREP PVP 2OZ (MISCELLANEOUS) ×1 IMPLANT
SURGILUBE 2OZ TUBE FLIPTOP (MISCELLANEOUS) ×2 IMPLANT
SUT VIC AB 3-0 SH 27 (SUTURE) ×2
SUT VIC AB 3-0 SH 27X BRD (SUTURE) ×1 IMPLANT
SYR 10ML LL (SYRINGE) ×2 IMPLANT
SYR 20ML LL LF (SYRINGE) ×2 IMPLANT

## 2019-11-19 NOTE — Anesthesia Postprocedure Evaluation (Signed)
Anesthesia Post Note  Patient: Holiday representative  Procedure(s) Performed: HEMORRHOIDECTOMY (N/A Rectum)  Patient location during evaluation: PACU Anesthesia Type: General Level of consciousness: awake and alert Pain management: pain level controlled Vital Signs Assessment: post-procedure vital signs reviewed and stable Respiratory status: spontaneous breathing, nonlabored ventilation, respiratory function stable and patient connected to nasal cannula oxygen Cardiovascular status: blood pressure returned to baseline and stable Postop Assessment: no apparent nausea or vomiting Anesthetic complications: no   No complications documented.   Last Vitals:  Vitals:   11/19/19 1055 11/19/19 1100  BP:  122/69  Pulse: 76 79  Resp: 14 17  Temp:    SpO2: 98% 96%    Last Pain:  Vitals:   11/19/19 1055  PainSc: 0-No pain                 Cleda Mccreedy Jaymeson Mengel

## 2019-11-19 NOTE — Anesthesia Preprocedure Evaluation (Signed)
Anesthesia Evaluation  Patient identified by MRN, date of birth, ID band Patient awake    Reviewed: Allergy & Precautions, H&P , NPO status , Patient's Chart, lab work & pertinent test results  History of Anesthesia Complications Negative for: history of anesthetic complications  Airway Mallampati: II  TM Distance: >3 FB Neck ROM: full    Dental  (+) Chipped   Pulmonary neg pulmonary ROS, neg shortness of breath,    Pulmonary exam normal        Cardiovascular Exercise Tolerance: Good hypertension, (-) angina(-) Past MI and (-) DOE Normal cardiovascular exam     Neuro/Psych PSYCHIATRIC DISORDERS negative neurological ROS     GI/Hepatic negative GI ROS, Neg liver ROS, neg GERD  ,  Endo/Other  negative endocrine ROS  Renal/GU      Musculoskeletal   Abdominal   Peds  Hematology negative hematology ROS (+)   Anesthesia Other Findings Past Medical History: No date: Adhesions of nasal septum and turbinates No date: Anxiety No date: Chronic low back pain No date: Depression No date: Ehlers-Danlos disease No date: Hypertension No date: Pre-diabetes  Past Surgical History: No date: CERVICAL BIOPSY No date: EAR CYST EXCISION     Reproductive/Obstetrics negative OB ROS                             Anesthesia Physical Anesthesia Plan  ASA: III  Anesthesia Plan: General LMA   Post-op Pain Management:    Induction: Intravenous  PONV Risk Score and Plan: Dexamethasone, Ondansetron, Midazolam and Treatment may vary due to age or medical condition  Airway Management Planned: LMA  Additional Equipment:   Intra-op Plan:   Post-operative Plan: Extubation in OR  Informed Consent: I have reviewed the patients History and Physical, chart, labs and discussed the procedure including the risks, benefits and alternatives for the proposed anesthesia with the patient or authorized  representative who has indicated his/her understanding and acceptance.     Dental Advisory Given  Plan Discussed with: Anesthesiologist, CRNA and Surgeon  Anesthesia Plan Comments: (Patient consented for risks of anesthesia including but not limited to:  - adverse reactions to medications - damage to eyes, teeth, lips or other oral mucosa - nerve damage due to positioning  - sore throat or hoarseness - Damage to heart, brain, nerves, lungs, other parts of body or loss of life  Patient voiced understanding.)        Anesthesia Quick Evaluation

## 2019-11-19 NOTE — Transfer of Care (Signed)
Immediate Anesthesia Transfer of Care Note  Patient: Tiffany Molina  Procedure(s) Performed: HEMORRHOIDECTOMY (N/A Rectum)  Patient Location: PACU  Anesthesia Type:General  Level of Consciousness: awake and drowsy  Airway & Oxygen Therapy: Patient Spontanous Breathing  Post-op Assessment: Report given to RN and Post -op Vital signs reviewed and stable  Post vital signs: Reviewed and stable  Last Vitals:  Vitals Value Taken Time  BP 121/64 11/19/19 1018  Temp 36.1 C 11/19/19 1018  Pulse 85 11/19/19 1025  Resp 16 11/19/19 1025  SpO2 96 % 11/19/19 1025    Last Pain:  Vitals:   11/19/19 1018  PainSc: 0-No pain         Complications: No complications documented.

## 2019-11-19 NOTE — Discharge Instructions (Addendum)
Bupivacaine Liposomal Suspension for Injection What is this medicine? BUPIVACAINE LIPOSOMAL (bue PIV a kane LIP oh som al) is an anesthetic. It causes loss of feeling in the skin or other tissues. It is used to prevent and to treat pain from some procedures. This medicine may be used for other purposes; ask your health care provider or pharmacist if you have questions. COMMON BRAND NAME(S): EXPAREL What should I tell my health care provider before I take this medicine? They need to know if you have any of these conditions:  G6PD deficiency  heart disease  kidney disease  liver disease  low blood pressure  lung or breathing disease, like asthma  an unusual or allergic reaction to bupivacaine, other medicines, foods, dyes, or preservatives  pregnant or trying to get pregnant  breast-feeding How should I use this medicine? This medicine is for injection into the affected area. It is given by a health care professional in a hospital or clinic setting. Talk to your pediatrician regarding the use of this medicine in children. Special care may be needed. Overdosage: If you think you have taken too much of this medicine contact a poison control center or emergency room at once. NOTE: This medicine is only for you. Do not share this medicine with others. What if I miss a dose? This does not apply. What may interact with this medicine? This medicine may interact with the following medications:  acetaminophen  certain antibiotics like dapsone, nitrofurantoin, aminosalicylic acid, sulfonamides  certain medicines for seizures like phenobarbital, phenytoin, valproic acid  chloroquine  cyclophosphamide  flutamide  hydroxyurea  ifosfamide  metoclopramide  nitric oxide  nitroglycerin  nitroprusside  nitrous oxide  other local anesthetics like lidocaine, pramoxine, tetracaine  primaquine  quinine  rasburicase  sulfasalazine This list may not describe all possible  interactions. Give your health care provider a list of all the medicines, herbs, non-prescription drugs, or dietary supplements you use. Also tell them if you smoke, drink alcohol, or use illegal drugs. Some items may interact with your medicine. What should I watch for while using this medicine? Your condition will be monitored carefully while you are receiving this medicine. Be careful to avoid injury while the area is numb, and you are not aware of pain. What side effects may I notice from receiving this medicine? Side effects that you should report to your doctor or health care professional as soon as possible:  allergic reactions like skin rash, itching or hives, swelling of the face, lips, or tongue  seizures  signs and symptoms of a dangerous change in heartbeat or heart rhythm like chest pain; dizziness; fast, irregular heartbeat; palpitations; feeling faint or lightheaded; falls; breathing problems  signs and symptoms of methemoglobinemia such as pale, gray, or blue colored skin; headache; fast heartbeat; shortness of breath; feeling faint or lightheaded, falls; tiredness Side effects that usually do not require medical attention (report to your doctor or health care professional if they continue or are bothersome):  anxious  back pain  changes in taste  changes in vision  constipation  dizziness  fever  nausea, vomiting This list may not describe all possible side effects. Call your doctor for medical advice about side effects. You may report side effects to FDA at 1-800-FDA-1088. Where should I keep my medicine? This drug is given in a hospital or clinic and will not be stored at home. NOTE: This sheet is a summary. It may not cover all possible information. If you have questions about this  medicine, talk to your doctor, pharmacist, or health care provider.  2020 Elsevier/Gold Standard (2018-12-30 10:48:23)      AMBULATORY SURGERY  DISCHARGE  INSTRUCTIONS   1) The drugs that you were given will stay in your system until tomorrow so for the next 24 hours you should not:  A) Drive an automobile B) Make any legal decisions C) Drink any alcoholic beverage   2) You may resume regular meals tomorrow.  Today it is better to start with liquids and gradually work up to solid foods.  You may eat anything you prefer, but it is better to start with liquids, then soup and crackers, and gradually work up to solid foods.   3) Please notify your doctor immediately if you have any unusual bleeding, trouble breathing, redness and pain at the surgery site, drainage, fever, or pain not relieved by medication.    4) Additional Instructions:        Please contact your physician with any problems or Same Day Surgery at [phone], Monday through Friday 6 am to 4 pm, or [place] [facility] number at [phone].Removal, Care After This sheet gives you information about how to care for yourself after your procedure. Your health care provider may also give you more specific instructions. If you have problems or questions, contact your health care provider. What can I expect after the procedure? After the procedure, it is common to have:  Soreness.  Bruising.  Itching. Follow these instructions at home: site care Follow instructions from your health care provider about how to take care of your site. Make sure you:  Leave stitches (sutures), skin glue, or adhesive strips in place. These skin closures may need to stay in place for 2 weeks or longer. If adhesive strip edges start to loosen and curl up, you may trim the loose edges. Do not remove adhesive strips completely unless your health care provider tells you to do that.  If the area bleeds or bruises, apply gentle pressure for 10 minutes.  OK TO SHOWER IN 24HRS  Check your site every day for signs of infection. Check for:  Redness, swelling, or pain.  Fluid or  blood.  Warmth.  Pus or a bad smell.  General instructions  Rest and then return to your normal activities as told by your health care provider. .  tylenol and advil as needed for discomfort.  Please alternate between the two every four hours as needed for pain.   .  Use narcotics, if prescribed, only when tylenol and motrin is not enough to control pain. .  325-650mg  every 8hrs to max of 3000mg /24hrs (including the 325mg  in every norco dose) for the tylenol.   .  Advil up to 800mg  per dose every 8hrs as needed for pain.    Keep all follow-up visits as told by your health care provider. This is important. Contact a health care provider if:  You have redness, swelling, or pain around your site.  You have fluid or blood coming from your site.  Your site feels warm to the touch.  You have pus or a bad smell coming from your site.  You have a fever.  Your sutures, skin glue, or adhesive strips loosen or come off sooner than expected. Get help right away if:  You have bleeding that does not stop with pressure or a dressing. Summary  After the procedure, it is common to have some soreness, bruising, and itching at the site.  Follow instructions from  your health care provider about how to take care of your site.  Check your site every day for signs of infection.  Contact a health care provider if you have redness, swelling, or pain around your site, or your site feels warm to the touch.  Keep all follow-up visits as told by your health care provider. This is important. This information is not intended to replace advice given to you by your health care provider. Make sure you discuss any questions you have with your health care provider. Document Released: 04/15/2015 Document Revised: 09/16/2017 Document Reviewed: 09/16/2017 Elsevier Interactive Patient Education  [employer].

## 2019-11-19 NOTE — Anesthesia Procedure Notes (Signed)
Procedure Name: LMA Insertion Date/Time: 11/19/2019 9:14 AM Performed by: Reece Agar, CRNA Pre-anesthesia Checklist: Patient identified, Emergency Drugs available, Suction available and Patient being monitored Patient Re-evaluated:Patient Re-evaluated prior to induction Oxygen Delivery Method: Circle system utilized Preoxygenation: Pre-oxygenation with 100% oxygen Induction Type: IV induction Ventilation: Mask ventilation without difficulty LMA: LMA inserted LMA Size: 4.0 Number of attempts: 1 Placement Confirmation: positive ETCO2 Dental Injury: Teeth and Oropharynx as per pre-operative assessment

## 2019-11-19 NOTE — Interval H&P Note (Signed)
History and Physical Interval Note:  11/19/2019 8:44 AM  Tiffany Molina  has presented today for surgery, with the diagnosis of K64.4 Residual hemorrhoidal skin tags.  The various methods of treatment have been discussed with the patient and family. After consideration of risks, benefits and other options for treatment, the patient has consented to  Procedure(s): HEMORRHOIDECTOMY (N/A) as a surgical intervention.  The patient's history has been reviewed, patient examined, no change in status, stable for surgery.  I have reviewed the patient's chart and labs.  Questions were answered to the patient's satisfaction.     Kazoua Gossen Tonna Boehringer

## 2019-11-19 NOTE — Op Note (Signed)
Preoperative diagnosis: third degree hemorrhoids.   Postoperative diagnosis: third  degree hemorrhoids.  Procedure: exam under anesthesia, single column hemorrhoidectomy.  Surgeon: Tonna Boehringer  Anesthesia: general  Specimen: hemorrhoids x1  Complications: none  EBL: 40mL  Wound classification: Clean Contaminated  Indications: Patient is a 34 y.o. female was found to have symptomatic hemorrhoids refractory to medical management.   Findings: 1. third  degree hemorrhoids 2. Internal and external anal sphincter palpated and preserved 3. Adequate hemostasis  Description of procedure: The patient was brought to the operating room and general anesthesia was induced. Patient was placed high lithotomy position. A time-out was completed verifying correct patient, procedure, site, positioning, and implant(s) and/or special equipment prior to beginning this procedure. The perineum was prepped and draped in standard sterile fashion. Local anesthetic was injected as a perianal block. An anoscope was introduced and hemorrhoidal pedicle identified.  A harmonic was placed across the base of the anterior pedicle and excess hemorrhoidal tissue removed.  Specimen was passed off operative field pending pathology.  3-0 Vicryl then used to close the open wound.  Last inspection of the anal canal did not note any additional hemorrhoidal tissue and no other pathology. Exparel injected as a perianal block. A gauze pad was tucked between the gluteal folds, and secured in place with mesh underwear.  The patient tolerated the procedure well and was taken to the postanesthesia care unit in stable condition.  Sponge and instrument count correct at end of procedure.

## 2019-11-20 ENCOUNTER — Encounter: Payer: Self-pay | Admitting: Surgery

## 2019-11-20 LAB — SURGICAL PATHOLOGY

## 2019-12-31 ENCOUNTER — Other Ambulatory Visit: Payer: Self-pay | Admitting: Cardiology

## 2019-12-31 DIAGNOSIS — I10 Essential (primary) hypertension: Secondary | ICD-10-CM
